# Patient Record
Sex: Female | Born: 1955
Health system: Southern US, Community
[De-identification: ages and names within clinical notes are randomized; demographics above are authoritative.]

---

## 1998-10-12 ENCOUNTER — Other Ambulatory Visit: Admission: RE | Admit: 1998-10-12 | Discharge: 1998-10-12 | Payer: Self-pay | Admitting: Obstetrics and Gynecology

## 1999-05-01 ENCOUNTER — Inpatient Hospital Stay (HOSPITAL_COMMUNITY): Admission: AD | Admit: 1999-05-01 | Discharge: 1999-05-02 | Payer: Self-pay | Admitting: *Deleted

## 1999-09-27 ENCOUNTER — Other Ambulatory Visit: Admission: RE | Admit: 1999-09-27 | Discharge: 1999-09-27 | Payer: Self-pay | Admitting: Obstetrics and Gynecology

## 2000-06-30 ENCOUNTER — Encounter: Payer: Self-pay | Admitting: Urology

## 2000-07-07 ENCOUNTER — Ambulatory Visit (HOSPITAL_COMMUNITY): Admission: RE | Admit: 2000-07-07 | Discharge: 2000-07-07 | Payer: Self-pay | Admitting: Urology

## 2000-07-24 ENCOUNTER — Observation Stay (HOSPITAL_COMMUNITY): Admission: RE | Admit: 2000-07-24 | Discharge: 2000-07-26 | Payer: Self-pay | Admitting: Urology

## 2000-09-23 ENCOUNTER — Other Ambulatory Visit: Admission: RE | Admit: 2000-09-23 | Discharge: 2000-09-23 | Payer: Self-pay | Admitting: Obstetrics and Gynecology

## 2000-12-11 ENCOUNTER — Encounter: Payer: Self-pay | Admitting: Internal Medicine

## 2000-12-11 ENCOUNTER — Emergency Department (HOSPITAL_COMMUNITY): Admission: EM | Admit: 2000-12-11 | Discharge: 2000-12-11 | Payer: Self-pay | Admitting: Internal Medicine

## 2001-11-24 ENCOUNTER — Other Ambulatory Visit: Admission: RE | Admit: 2001-11-24 | Discharge: 2001-11-24 | Payer: Self-pay | Admitting: Obstetrics and Gynecology

## 2002-11-26 ENCOUNTER — Other Ambulatory Visit: Admission: RE | Admit: 2002-11-26 | Discharge: 2002-11-26 | Payer: Self-pay | Admitting: Obstetrics and Gynecology

## 2004-02-08 ENCOUNTER — Other Ambulatory Visit: Admission: RE | Admit: 2004-02-08 | Discharge: 2004-02-08 | Payer: Self-pay | Admitting: Obstetrics and Gynecology

## 2005-02-14 ENCOUNTER — Other Ambulatory Visit: Admission: RE | Admit: 2005-02-14 | Discharge: 2005-02-14 | Payer: Self-pay | Admitting: Obstetrics and Gynecology

## 2005-02-27 ENCOUNTER — Encounter: Admission: RE | Admit: 2005-02-27 | Discharge: 2005-02-27 | Payer: Self-pay | Admitting: Endocrinology

## 2005-03-01 ENCOUNTER — Encounter: Admission: RE | Admit: 2005-03-01 | Discharge: 2005-03-01 | Payer: Self-pay | Admitting: Endocrinology

## 2005-09-19 IMAGING — US US ABDOMEN COMPLETE
1 series · 13 of 25 positions shown · non-contrast
Comparison: none

CLINICAL DATA: Bloating, vomiting, elevated LFTs.  Patient is diabetic.  Previous cholecystectomy. 
 COMPLETE ABDOMINAL ULTRASOUND: 
 Multiple scans of the entire abdomen show the gallbladder to have been removed.  Common bile duct appears to be within the normal limit with a diameter of 2.5mm.  The liver is echogenic in a diffuse fashion and shows no focal disease.  There is a relatively lucent area along the inferior margin of the liver that could represent a fluid collection vs a cyst on the margin of the liver which measures 2.8 x 2.9 x 7.8cm in maximum dimensions.  There is limited visualization of the inferior vena cava due to overlying gas and the same is true of the pancreas.  The spleen is normal and measures 12.0cm in length.  The right kidney is normal and measures 10.4cm.  The left kidney is normal and measures 10.9cm.  The abdominal aorta is normal and measures 2.1cm.  
 The entire study is limited due to gas and the patient?s body habitus.

[Series 1: unknown · 13 of 69 slices shown]
[im 1/69]
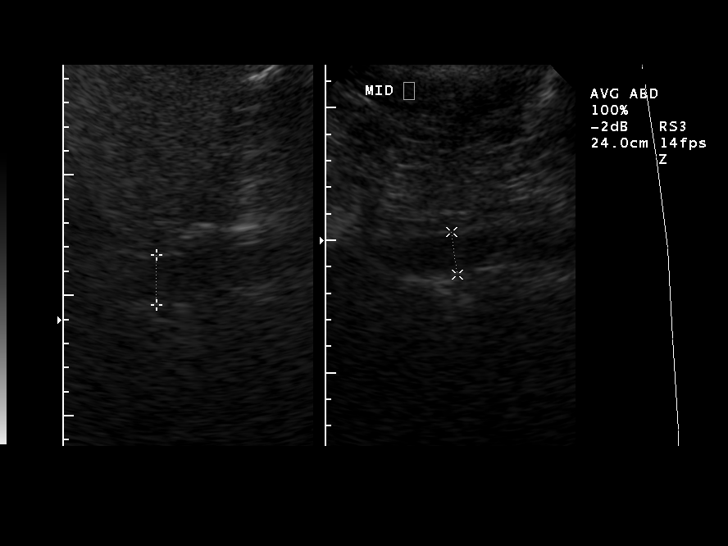
[im 6/69]
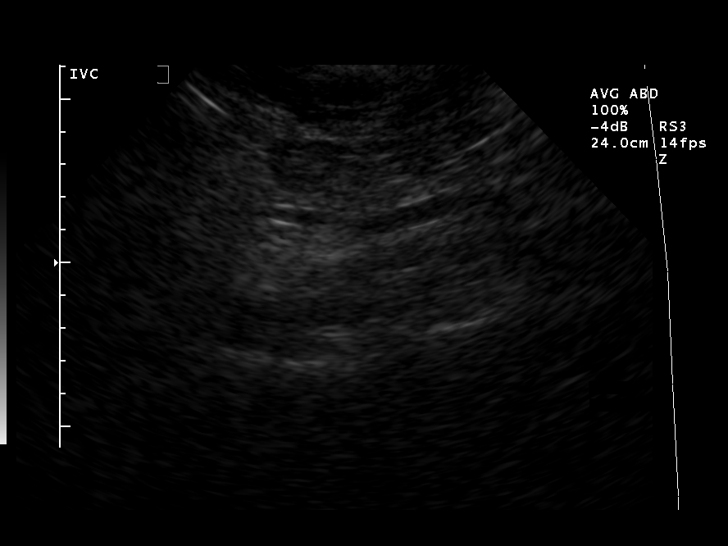
[im 12/69]
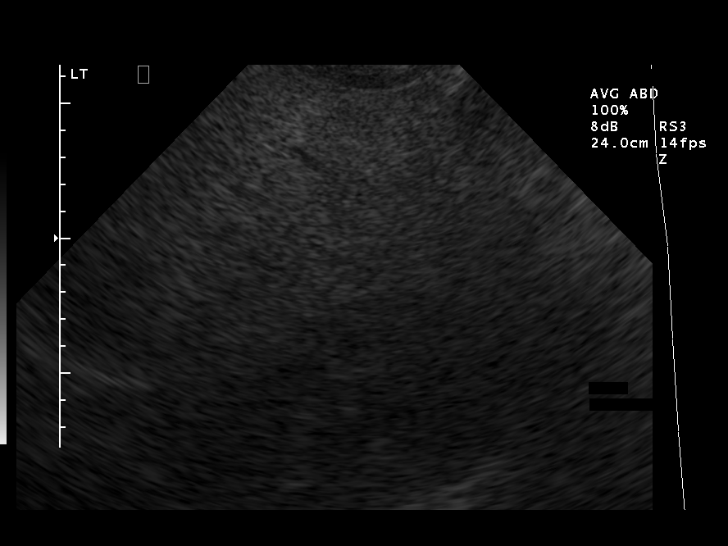
[im 18/69]
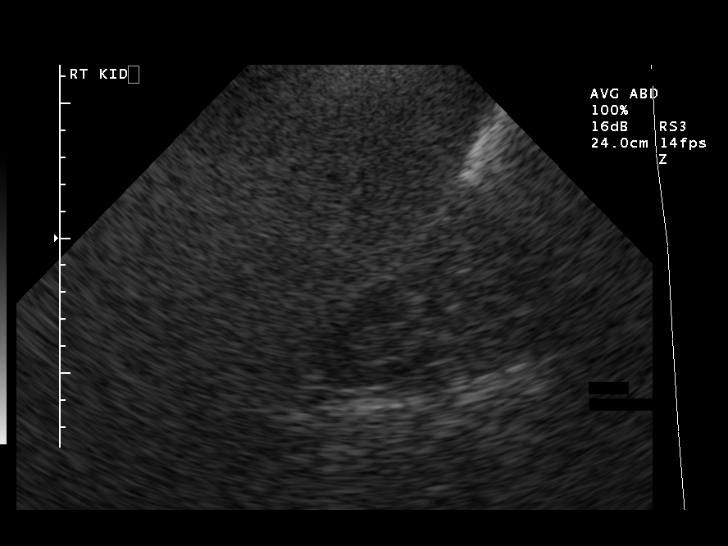
[im 23/69]
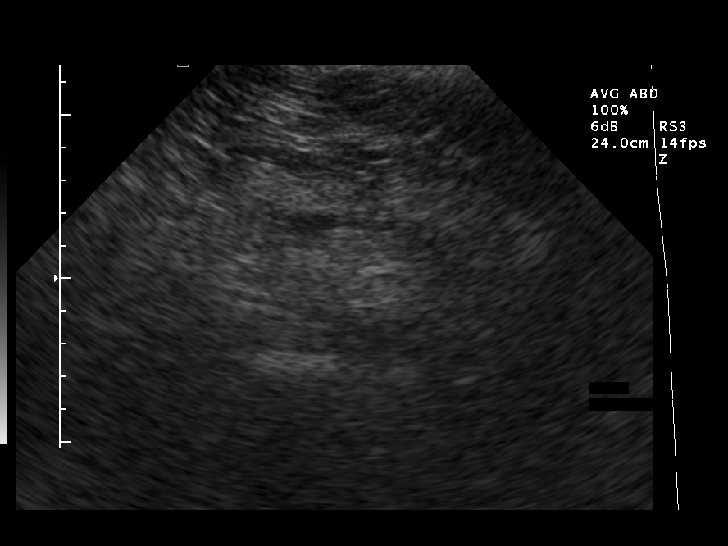
[im 29/69]
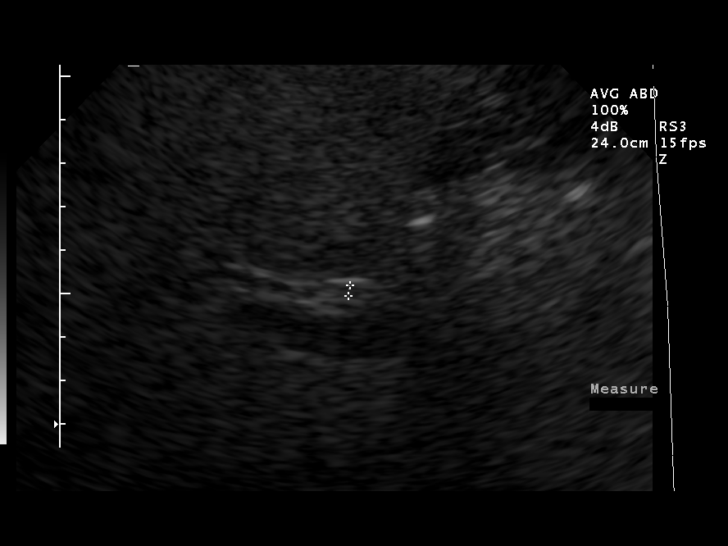
[im 35/69]
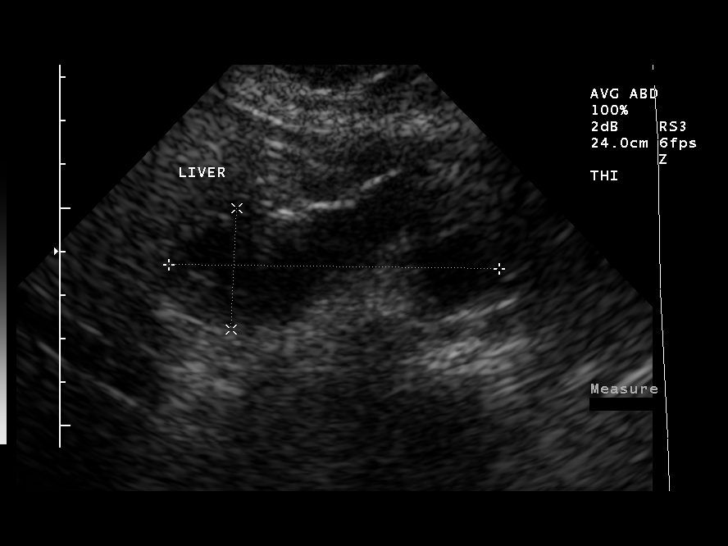
[im 40/69]
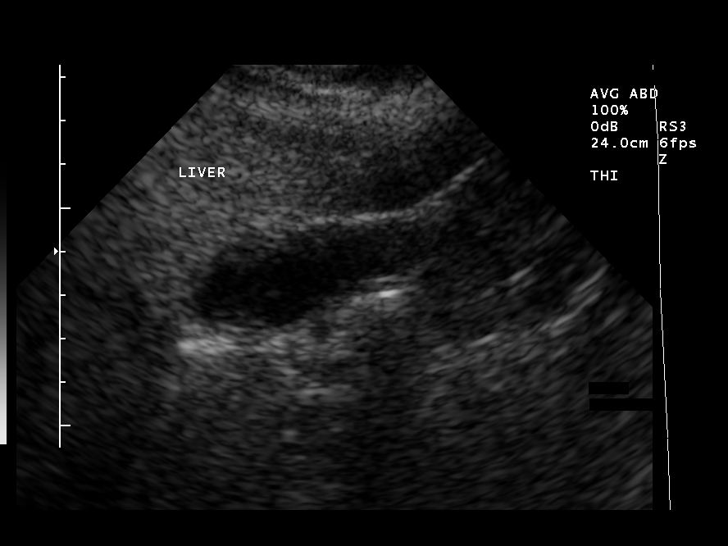
[im 46/69]
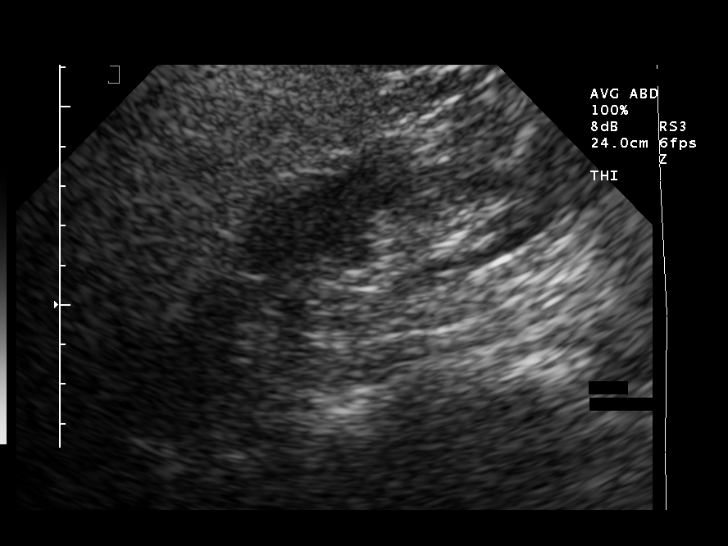
[im 52/69]
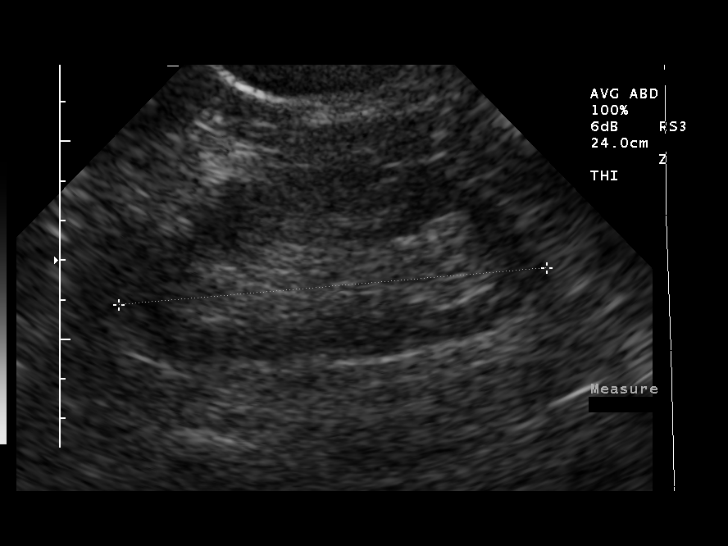
[im 57/69]
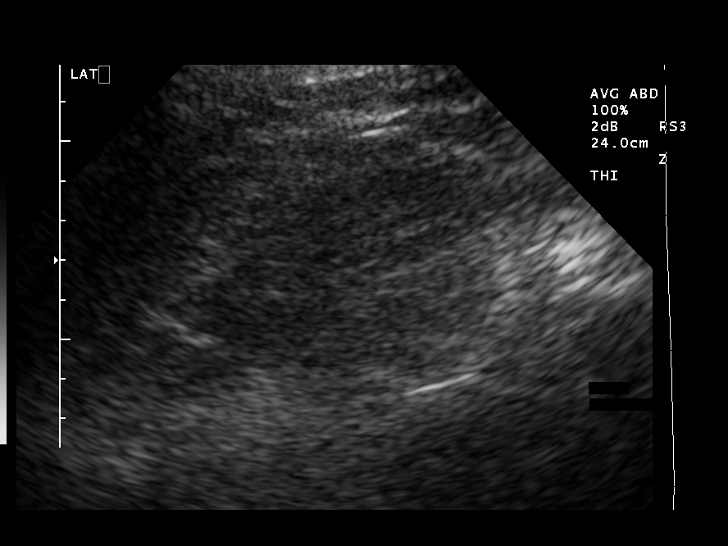
[im 63/69]
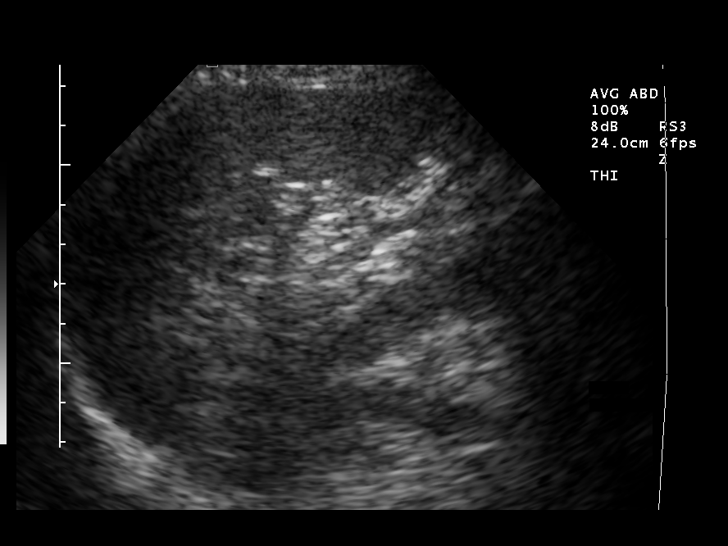
[im 69/69]
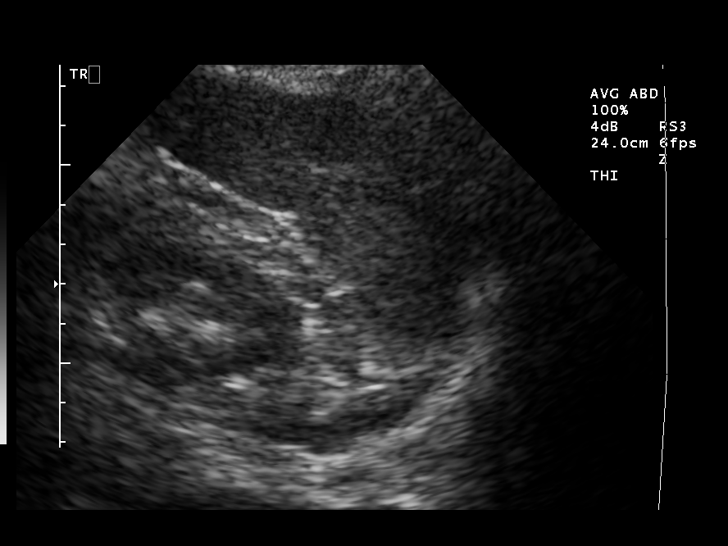

[13 of 25 positions shown; findings below may reference images not displayed]

IMPRESSION: 1.  Status post cholecystectomy with a fluid collection vs cyst along the margin of the liver measuring 2.8 x 2.9 x 7.8cm.  The liver is echogenic in a diffuse fashion.  The common bile duct appears normal and measures 2.5mm.  
 2.  I would recommend a CT scan of the abdomen with and without contrast using the pancreas protocol for further evaluation of this patient.

## 2005-09-21 IMAGING — CT CT ABDOMEN W/ CM
1 of 2 series · 15 of 32 positions shown, 19 images · IV contrast (GASTRO & [ID] OMNI [id number])
Comparison: none

CLINICAL DATA: Abdominal pain. Prior cholecystectomy. Repair of severed bile
duct.

[Series 2: routine abdomen · axial · 0.78mm/px · z∈[-306,-11]mm · 15 of 64 slices shown, 19 images]
[im 3/64  soft-tissue]
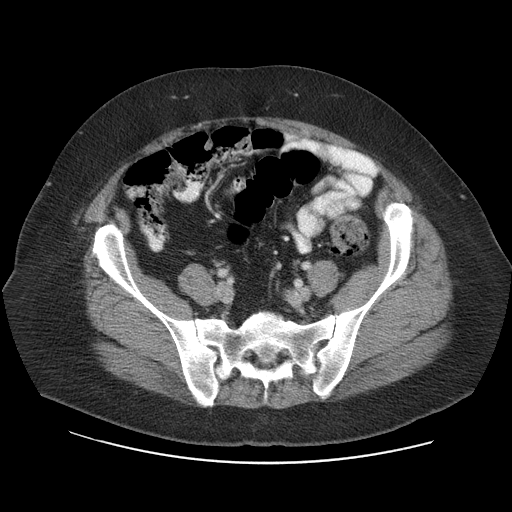
[im 3/64  bone]
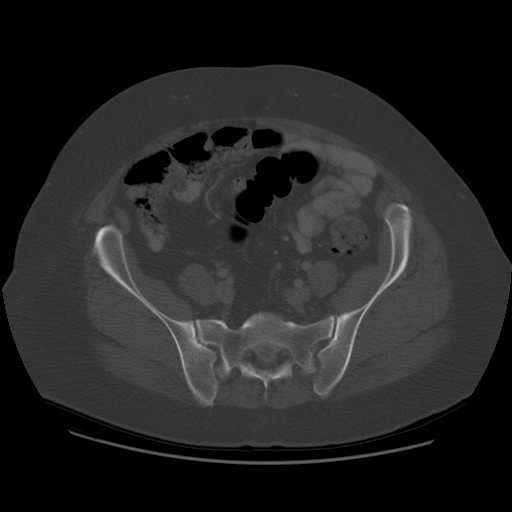
[im 9/64  soft-tissue]
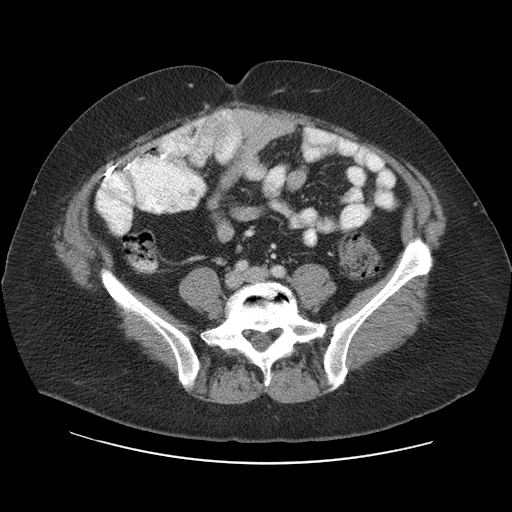
[im 15/64  soft-tissue]
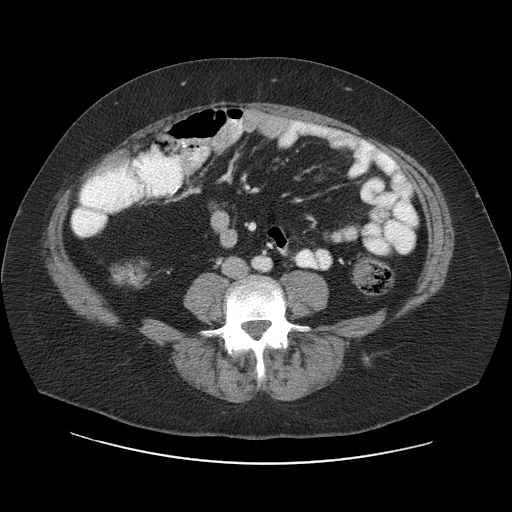
[im 18/64  soft-tissue]
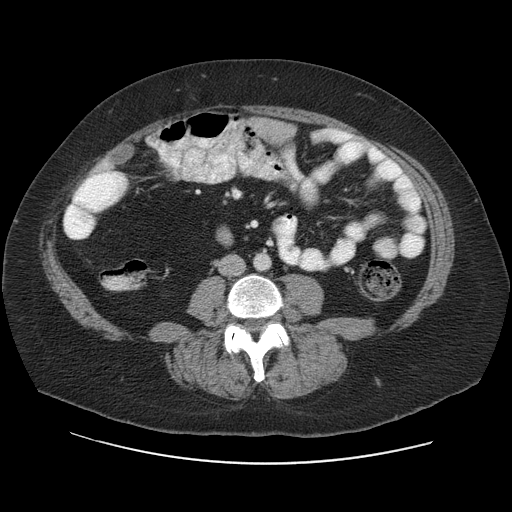
[im 23/64  soft-tissue]
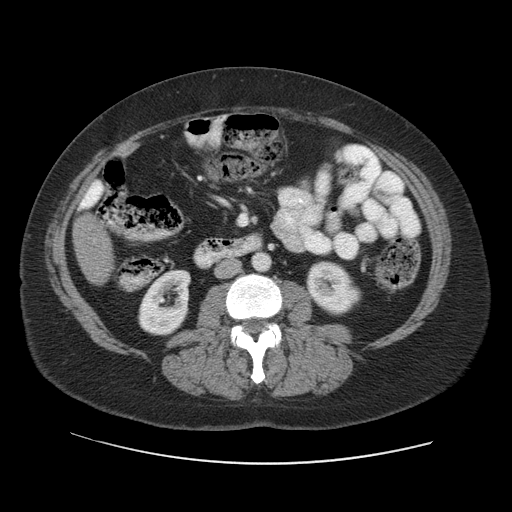
[im 26/64  soft-tissue]
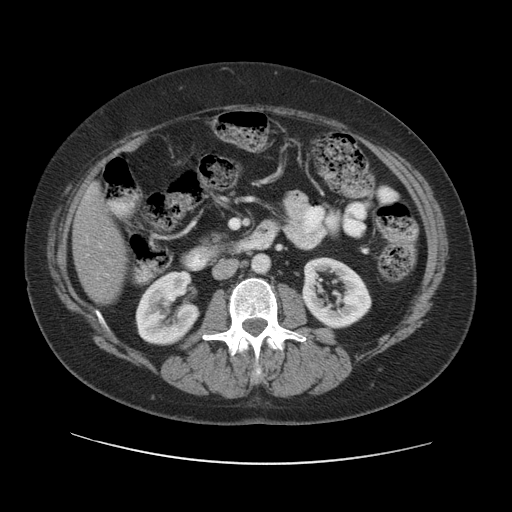
[im 32/64  soft-tissue]
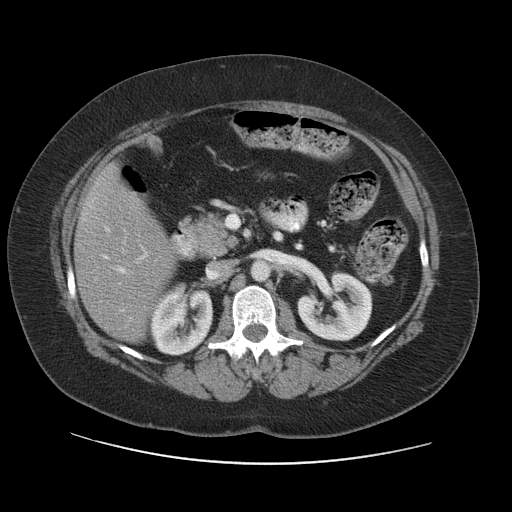
[im 38/64  soft-tissue]
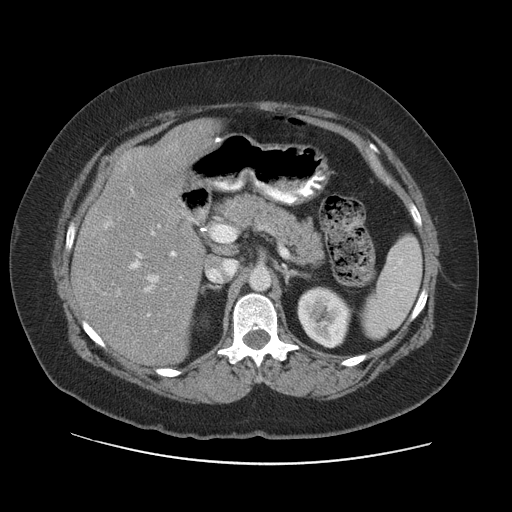
[im 41/64  soft-tissue]
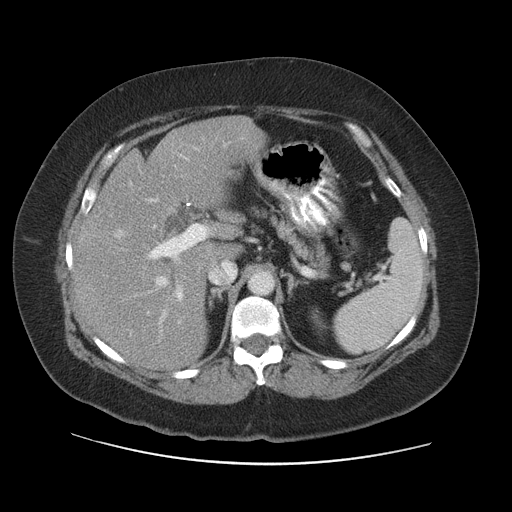
[im 41/64  bone]
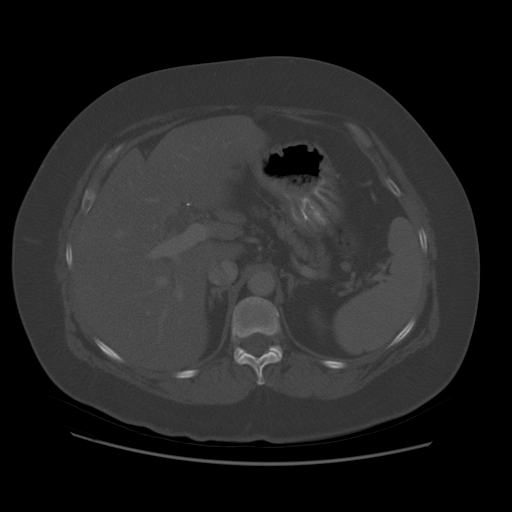
[im 46/64  soft-tissue]
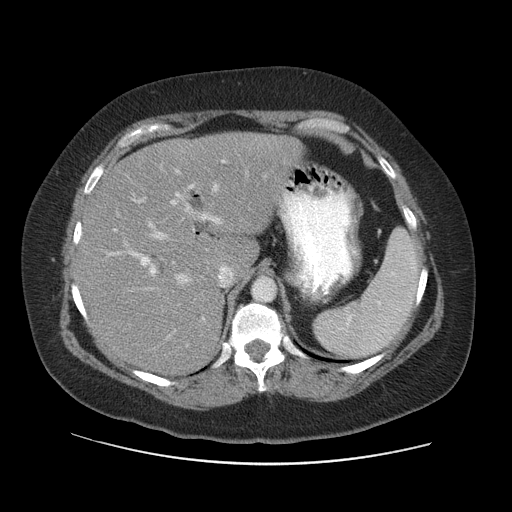
[im 49/64  soft-tissue]
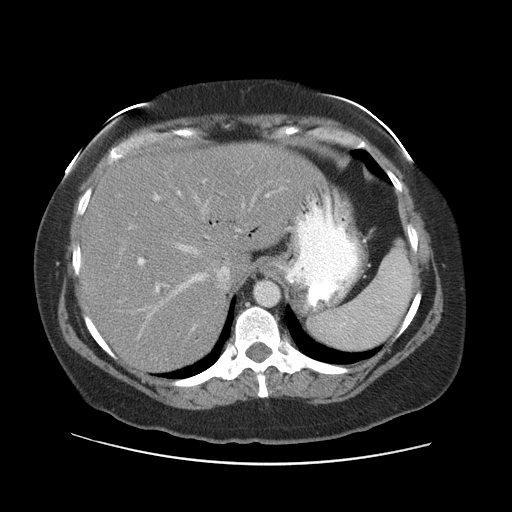
[im 52/64  lung]
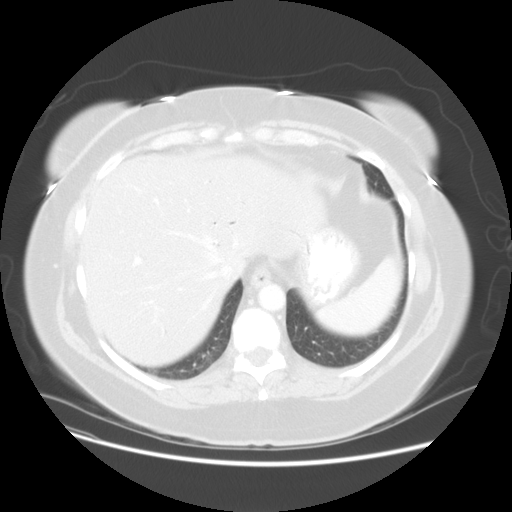
[im 55/64  soft-tissue]
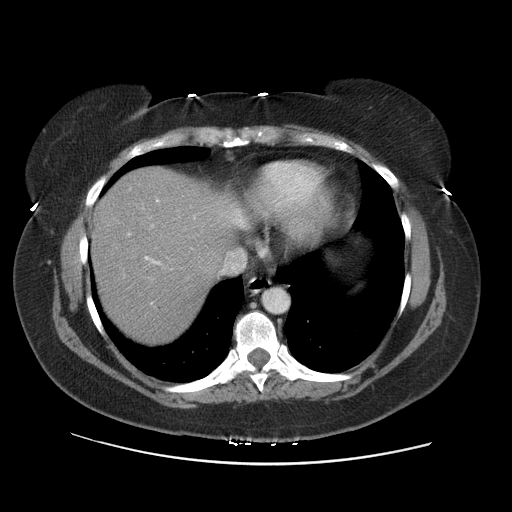
[im 55/64  lung]
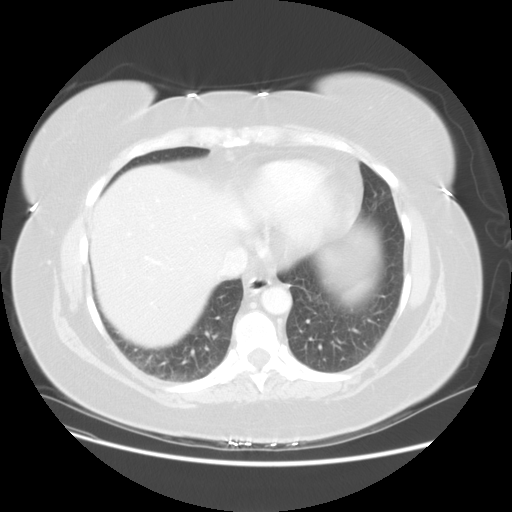
[im 58/64  lung]
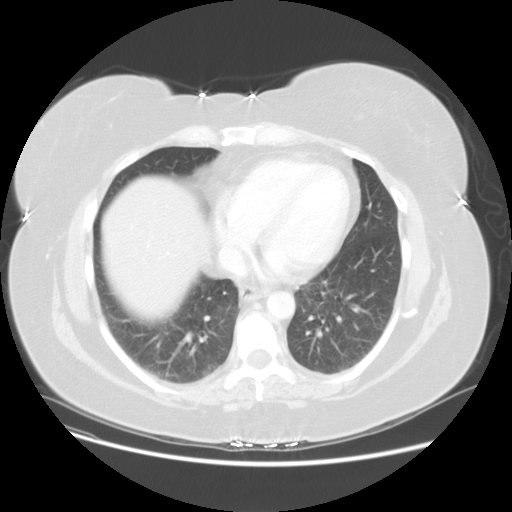
[im 61/64  soft-tissue]
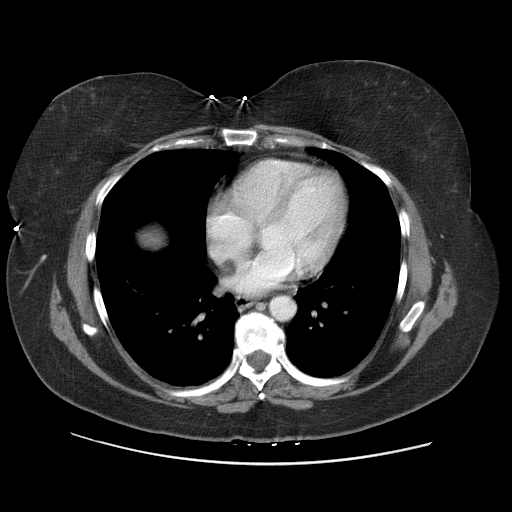
[im 61/64  lung]
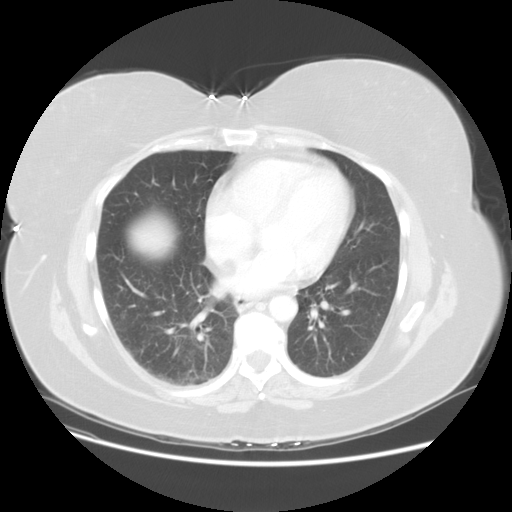

[15 of 32 positions shown; findings below may reference images not displayed]

CT abdomen with contrast:

Multidetector helical CT after 125 ml [id number]-[id number] IV. No previous CT for
comparison. Visualized portions of lung bases clear. Mild diffuse fatty
infiltration of the liver. There is gas in the biliary tree, which is mildly
dilated centrally. Vascular clips in the gallbladder fossa. The common bile duct
is nondilated. I suspect the patient may have had a choledochojejunostomy, with
a decompressed bowel loop extending from the gallbladder fossa anterolaterally
and inferiorly to the level of anastomotic staples with more distal small bowel.
The bowel at the level of the anastomosis is mildly distended. The remainder of
small bowel is decompressed, with good progression of the  oral contrast through
the remainder of visualized small bowel. Unremarkable spleen, adrenal glands,
kidneys, pancreas. Pancreatic duct decompressed. Portal vein remains patent. No
free air. No ascites. Mild degenerative changes in the lower lumbar spine.
Visualized portions of colon decompressed, unremarkable. Delayed scans showed
decompressed proximal ureters.
IMPRESSION: 1. Changes of probable choledochojejunostomy. Although there is mild dilatation
of the central biliary tree, the presence of gas within the   biliary tree would
imply patency of the anastomosis.
2. Mild diffuse fatty infiltration of the liver.

## 2007-07-16 ENCOUNTER — Encounter: Admission: RE | Admit: 2007-07-16 | Discharge: 2007-07-16 | Payer: Self-pay | Admitting: Endocrinology

## 2011-03-08 NOTE — Op Note (Signed)
[facility]  Patient:    Kristin Olson, Kristin Olson                       MRN: [id number] Proc. Date: 07/24/00 Adm. Date:  [id number] Attending:  Laqueta Jean                           Operative Report  PREOPERATIVE DIAGNOSIS:  Urinary incontinence.  POSTOPERATIVE DIAGNOSIS:  Urinary incontinence.  OPERATION:  Pubovaginal sling.  SURGEON:  Sigmund I. Patsi Sears, M.D.  ANESTHESIA:  General endotracheal.  PREPARATION:  After appropriate preanesthesia, the patient is brought to the operating room and placed on the operating table in the dorsal supine position where general endotracheal anesthesia was introduced.  She was then replaced in the low Allen stirrups, dorsal lithotomy position, where the pubis was prepped with Betadine solution and draped in the usual fashion.  DESCRIPTION OF PROCEDURE:  Marcaine 20 cc 0.5% with 1:200,000 epinephrine was injected into the pubovaginal cervical arch.  Following this, two separate incisions were made in the pubovaginal cervical arch, and dissection was carried as a tunnel across the midline.  A 7 cm portion of ______  fascia was selected, measuring 4 cm.  This was folded, sutured.  Dissection was carried into the retropubic space with some bleeding noted from Venus veins. The transvaginal anchor system was then used to place anchors in the retropubic space with #1 Prolene sutures attached.  The sutures were then attached to the sling which was placed across the tunnel and brought to the retropubic space.  Sutures were cut and bleeding stopped.  There was no tension on the urethra.  The incisions were irrigated copiously with antibiotic irrigation and closed with inverted running 3-0 Vicryl suture. Packing was placed.  Cystoscopy showed no suture within the bladder and normal bladder neck.  The Foley catheter.  The patient was awakened and taken to the recovery room after being given IV Toradol.  She is in good  condition. DD:  07/24/00 TD:  07/24/00 Job: [id number]

## 2013-04-22 ENCOUNTER — Ambulatory Visit (INDEPENDENT_AMBULATORY_CARE_PROVIDER_SITE_OTHER): Payer: Medicare Other | Admitting: Endocrinology

## 2013-04-22 ENCOUNTER — Encounter: Payer: Self-pay | Admitting: Endocrinology

## 2013-04-22 VITALS — BP 124/74 | HR 90 | Ht 64.0 in | Wt 180.8 lb

## 2013-04-22 DIAGNOSIS — E785 Hyperlipidemia, unspecified: Secondary | ICD-10-CM

## 2013-04-22 DIAGNOSIS — E119 Type 2 diabetes mellitus without complications: Secondary | ICD-10-CM

## 2013-04-22 DIAGNOSIS — IMO0002 Reserved for concepts with insufficient information to code with codable children: Secondary | ICD-10-CM

## 2013-04-22 NOTE — Patient Instructions (Addendum)
Sugar checks 2 hrs after meals 2-3x per week, exercise more  Slo- Niacin 1000mg  daily

## 2013-04-22 NOTE — Progress Notes (Signed)
Patient ID: Kristin Olson, female   DOB: 1955/12/19, 57 y.o.   MRN: [id number] Reason for Appointment: Diabetes follow-up   History of Present Illness   Diagnosis: Type 2 DIABETES MELITUS, date of diagnosis: 1999          Oral hypoglycemic drugs: Metformin, Victoza        Side effects from medications: None Proper timing of medications in relation to meals: Yes.         Monitors blood glucose: Once a day.    Glucometer: One Touch.          Blood Glucose readings: Glucometer not downloaded: readings before breakfast: 60-130; after meals upto 220.   Hypoglycemia frequency: none now come occasionally on Victoza.           Not on Victoza 2 weeks, because she thinks she had difficulty with nausea when she was taking this. Also cannot afford this because of insurance not covering it. Previously it had helped her with portion control and weight loss. Meals: 3 meals per day. she is however having difficulty controlling drinks with sugar and is irregular with meal planning even though she has been to the dietitian before          Physical activity: exercise: None recently, was unable to do so because of her biliary tract problems. She plans to start going to the [facility] next week                   Complications: are: None    The last HbgA1c was reported as 6.1    HYPERLIPIDEMIA:         The lipid abnormality consists of elevated LDL.  taking OTC Slo-niacin dose, currently 500 milligrams. She previously was on 1000 mg and is not sure why she had reduced the dose. Also has been irregular with her Lipitor for no apparent reason but recently started back again and her LDL is at target  LAB results: Glucose 83, A1c 6.1%, triglycerides 193, HDL 33 and LDL 71, normal liver functions, date of test 6/16     Medication List       This list is accurate as of: 04/22/13  9:14 AM.  Always use your most recent med list.               atorvastatin 20 MG tablet  Commonly known as:  LIPITOR  Take 20 mg by mouth  daily.     B-D ULTRAFINE III SHORT PEN 31G X 8 MM Misc  Generic drug:  Insulin Pen Needle  by Does not apply route. Use as directed     celecoxib 200 MG capsule  Commonly known as:  CELEBREX  Take 200 mg by mouth 2 (two) times daily. 1 capsule orally twice a day     glucose blood test strip  1 each by Other route as needed for other (One Touch Ultra Glucometer). Use as instructed     glucose blood test strip  1 each by Other route as needed for other (twice a day). Use as instructed     LORazepam 1 MG tablet  Commonly known as:  ATIVAN  1 mg.     metFORMIN 1000 MG tablet  Commonly known as:  GLUCOPHAGE  Take 1,000 mg by mouth 2 (two) times daily with a meal. 1 tablet orally     ONETOUCH DELICA LANCETS FINE Misc  by Does not apply route. Use twice a day     oxybutynin 15 MG 24  hr tablet  Commonly known as:  DITROPAN XL  Take 15 mg by mouth daily.     oxycodone 5 MG capsule  Commonly known as:  OXY-IR  Take 5 mg by mouth every 4 (four) hours as needed (2 tablets).     risperiDONE 2 MG tablet  Commonly known as:  RISPERDAL  Take 2 mg by mouth once.     sertraline 100 MG tablet  Commonly known as:  ZOLOFT  Take 100 mg by mouth daily. 2 tablets by mouth daily     SUMAtriptan 100 MG tablet  Commonly known as:  IMITREX  Take 100 mg by mouth every 4 (four) hours as needed for migraine.     VICTOZA 18 MG/3ML Sopn  Generic drug:  Liraglutide  Inject 1.8 mg into the skin.        No past medical history on file.  No past surgical history on file.  No family history on file.  Social History:  reports that she has never smoked. She does not have any smokeless tobacco history on file. Her alcohol and drug histories are not on file.  Allergies: Allergies not on file   Review of systems:  She has had problems with depression, asking about muscle cramps especially at night. Has not had any recent abnormality of liver functions which was due to obstructive biliary  disease. She has been treated at [facility] with biliary drainage tubes   Examination:   BP 124/74  Pulse 90  Ht 5\' 4"  (1.626 m)  Wt 180 lb 12.8 oz (82.01 kg)  BMI 31.02 kg/m2  SpO2 97%  Body mass index is 31.02 kg/(m^2).   Assesment:   1. Diabetes type 2, uncontrolled - 250.02  The patient's diabetes control appears to be well controlled despite her not taking Victoza. Also her weight appears to be improved since March despite not exercising and taking Victoza recently. Not clear why she reports symptoms of hypoglycemia when she was taking Victoza and claims of blood sugars as low as 40. However has not checked her blood sugar recently very much and not clear if  her meter is accurate. Her husband is present today and discussed recumbency and timing of glucose monitoring, timing of glucose monitoring, balanced meals and avoiding excessive sugar and drinks as well as exercise. She will bring her monitor for download on the next visit. HYPERTENSION: Currently not requiring medications HYPERLIPIDEMIA: Her HDL is still relatively lower at 33 the she had been on 1000 mg of niacin and is only taking 500 now without any side effects. Previous evaluation had shown small dense particles and have encouraged her to go back to 1000 mg, discussed prevention of flushing with aspirin    PLAN:   1. To check home blood sugars on waking up or 2 hours after any meal  2. Walking or other exercise for up to 30 minutes every day.  3. Medication changes:  none, may leave off Victoza 4. Increased niacin as above   Kristin Olson 04/22/2013, 9:14 AM

## 2013-05-28 ENCOUNTER — Other Ambulatory Visit: Payer: Self-pay | Admitting: *Deleted

## 2013-05-28 MED ORDER — METFORMIN HCL 1000 MG PO TABS: 1000.0000 mg | ORAL_TABLET | Freq: Two times a day (BID) | ORAL | Status: DC

## 2013-05-28 MED ORDER — ATORVASTATIN CALCIUM 20 MG PO TABS: 20.0000 mg | ORAL_TABLET | Freq: Every day | ORAL | Status: DC

## 2013-06-14 ENCOUNTER — Encounter: Payer: Self-pay | Admitting: Endocrinology

## 2013-07-23 ENCOUNTER — Ambulatory Visit: Payer: Medicare Other | Admitting: Endocrinology

## 2013-07-26 ENCOUNTER — Ambulatory Visit: Payer: Medicare Other | Admitting: Endocrinology

## 2013-08-05 LAB — COMPREHENSIVE METABOLIC PANEL
AST: 26 IU/L (ref 0–40)
Albumin/Globulin Ratio: 1.7 (ref 1.1–2.5)
Albumin: 4.3 g/dL (ref 3.5–5.5)
BUN: 9 mg/dL (ref 6–24)
CO2: 27 mmol/L (ref 18–29)
GFR calc Af Amer: 78 mL/min/{1.73_m2} (ref >59)
GFR calc non Af Amer: 67 mL/min/{1.73_m2} (ref >59)
Globulin, Total: 2.5 g/dL (ref 1.5–4.5)
Glucose: 103 mg/dL — ABNORMAL HIGH (ref 65–99)
Total Bilirubin: 0.2 mg/dL (ref 0.0–1.2)
Total Protein: 6.8 g/dL (ref 6.0–8.5)

## 2013-08-05 LAB — NMR, LIPOPROFILE
HDL Cholesterol by NMR: 32 mg/dL — ABNORMAL LOW (ref >=40)
HDL Particle Number: 31.5 umol/L (ref >=30.5)
LDL Particle Number: 1177 nmol/L — ABNORMAL HIGH (ref <1000)
LDL Size: 19.4 nm — ABNORMAL LOW (ref >20.5)
LDLC SERPL CALC-MCNC: 55 mg/dL (ref <100)
LP-IR Score: 80 — ABNORMAL HIGH (ref <=45)

## 2013-08-26 ENCOUNTER — Other Ambulatory Visit: Payer: Self-pay

## 2013-08-26 ENCOUNTER — Encounter: Payer: Self-pay | Admitting: Endocrinology

## 2013-08-26 ENCOUNTER — Ambulatory Visit (INDEPENDENT_AMBULATORY_CARE_PROVIDER_SITE_OTHER): Payer: Medicare Other | Admitting: Endocrinology

## 2013-08-26 ENCOUNTER — Other Ambulatory Visit: Payer: Self-pay | Admitting: *Deleted

## 2013-08-26 VITALS — BP 122/82 | HR 85 | Temp 98.3°F | Resp 12 | Ht 64.0 in | Wt 195.2 lb

## 2013-08-26 DIAGNOSIS — E785 Hyperlipidemia, unspecified: Secondary | ICD-10-CM

## 2013-08-26 NOTE — Patient Instructions (Signed)
Slo-Niacin 2x daily  Check cost of Bydureon

## 2013-08-26 NOTE — Progress Notes (Signed)
Patient ID: Kristin Olson, female   DOB: Mar 13, 1956, 57 y.o.   MRN: [id number]  Reason for Appointment: Diabetes follow-up   History of Present Illness   Diagnosis: Type 2 DIABETES MELITUS, date of diagnosis: 1999     She has had mild diabetes for several years which has been usually well controlled with metformin alone. Because of somewhat higher blood sugars and if her weight gain she was tried on diet in 2006 and subsequently Victoza  However because she thinks she had difficulty with nausea when she was taking this and could not afford this because of insurance noncoverage she stopped taking this on her last visit.  Previously it had helped her with portion control and weight loss.     She has gained weight although her blood sugars   as judged by A1c are not higher. She did have high readings about 2 weeks ago with epidural steroid She still has periodic high blood sugars although she claims that they are not as high with switching her glucose monitor about 5 days ago  Oral hypoglycemic drugs: Metformin, Victoza        Side effects from medications: None Proper timing of medications in relation to meals: Yes.         Monitors blood glucose: Once a day.    Glucometer: One Touch.          Blood Glucose readings:  recently 79-181 but previously as high as 208. Overall median 140 Hypoglycemia frequency: none now       Meals: 3 meals per day. she is now controlling drinks with sugar and is irregular with meal planning even though she has been to the dietitian before          Physical activity: exercise: None recently, was unable to do so because of her back pain                  Complications: are: None    The prior HbgA1c was reported as 6.1    HYPERLIPIDEMIA:         The lipid abnormality consists of elevated LDL, high triglycerides and low HDL.  she is taking OTC Slo-niacin dose, currently only 500 mg.  She previously was on 1000 mg and is not sure why she had reduced the dose.  Her  LDL has been at target   LAB results:  No visits with results within 1 Week(s) from this visit. Latest known visit with results is:  Office Visit on 04/22/2013  Component Date Value Range Status  . LDL Particle Number 08/03/2013 1177* <1000 nmol/L Final   Comment:                           Low                   < 1000                                                    Moderate         1000 - 1299  Borderline-High  1300 - 1599                                                    High             1600 - 2000                                                    Very High             > 2000  . LDLC SERPL CALC-MCNC 08/03/2013 55  <100 mg/dL Final   Comment:                           Optimal               <  100                                                    Above optimal     100 -  129                                                    Borderline        130 -  159                                                    High              160 -  189                                                    Very high             >  189  . HDL Cholesterol by NMR 08/03/2013 32* >=40 mg/dL Final  . Triglycerides by NMR 08/03/2013 216* <150 mg/dL Final  . Cholesterol 40/34/7425 130  <200 mg/dL Final  . HDL Particle Number 08/03/2013 31.5  >=30.5 umol/L Final  . Small LDL Particle Number 08/03/2013 1146* <=527 nmol/L Final  . LDL Size 08/03/2013 19.4* >95.6 nm Final   Comment:  ----------------------------------------------------------                                           ** INTERPRETATIVE INFORMATION**  PARTICLE CONCENTRATION AND SIZE                                              <--Lower CVD Risk   Higher CVD Risk-->                            LDL AND HDL PARTICLES   Percentile in Reference Population                            HDL-P (total)        High     75th    50th    25th   Low                                                  >34.9    34.9    30.5    26.7   <26.7                            Small LDL-P          Low      25th    50th    75th   High                                                 <117     117     527     839    >839                            LDL Size   <-Large (Pattern A)->    <-Small (Pattern B)->                                              23.0    20.6           20.5      19.0                           ----------------------------------------------------------  . LP-IR Score 08/03/2013 80* <=45 Final   Comment: The LP-IR Score combines information from lipoprotein                          particle concentration and size to give improved                          assessment of insulin resistance and diabetes risk.                          Small LDL-P, LDL Particle Size, HDL-Particle, and  LP-IR Score have been validated by [employer]                          but not cleared by [place] FDA; the clinical utility                          of these test results has not been fully established.                          INSULIN RESISTANCE MARKER                              <--Insulin Sensitive    Insulin Resistant-->                                     Percentile in Reference Population                          Insulin Resistance Score                          LP-IR Score   Low   25th   50th   75th   High                                        <27   27     45     63     >63  . Glucose 08/03/2013 103* 65 - 99 mg/dL Final  . BUN 40/98/1191 9  6 - 24 mg/dL Final  . Creatinine, Ser 08/03/2013 0.93  0.57 - 1.00 mg/dL Final  . GFR calc non Af Amer 08/03/2013 67  >59 mL/min/1.73 Final  . GFR calc Af Amer 08/03/2013 78  >59 mL/min/1.73 Final  . BUN/Creatinine Ratio 08/03/2013 10  9 - 23 Final  . Sodium 08/03/2013 145* 134 - 144 mmol/L Final  . Potassium 08/03/2013 4.5  3.5 - 5.2 mmol/L Final  . Chloride 08/03/2013 101  97 - 108 mmol/L Final  . CO2 08/03/2013 27  18 - 29  mmol/L Final  . Calcium 08/03/2013 9.6  8.7 - 10.2 mg/dL Final  . Total Protein 08/03/2013 6.8  6.0 - 8.5 g/dL Final  . Albumin 47/82/9562 4.3  3.5 - 5.5 g/dL Final  . Globulin, Total 08/03/2013 2.5  1.5 - 4.5 g/dL Final  . Albumin/Globulin Ratio 08/03/2013 1.7  1.1 - 2.5 Final  . Total Bilirubin 08/03/2013 0.2  0.0 - 1.2 mg/dL Final  . Alkaline Phosphatase 08/03/2013 102  39 - 117 IU/L Final  . AST 08/03/2013 26  0 - 40 IU/L Final  . ALT 08/03/2013 28  0 - 32 IU/L Final  . Hemoglobin A1C 08/03/2013 6.3* 4.8 - 5.6 % Final   Comment:          Increased risk for diabetes: 5.7 - 6.4                                   Diabetes: >6.4  Glycemic control for adults with diabetes: <7.0  . Estimated average glucose 08/03/2013 134   Final     Glucose 83, A1c 6.1%, triglycerides 193, HDL 33 and LDL 71, normal liver functions, date of test 6/16     Medication List       This list is accurate as of: 08/26/13 10:43 AM.  Always use your most recent med list.               aspirin 325 MG tablet  Take 325 mg by mouth daily.     atorvastatin 20 MG tablet  Commonly known as:  LIPITOR  Take 1 tablet (20 mg total) by mouth daily.     buPROPion 150 MG 24 hr tablet  Commonly known as:  WELLBUTRIN XL  Take 150 mg by mouth 3 (three) times daily.     calcium citrate-vitamin D 315-200 MG-UNIT per tablet  Commonly known as:  CITRACAL+D  Take 1 tablet by mouth 2 (two) times daily.     gabapentin 400 MG capsule  Commonly known as:  NEURONTIN  Take 400 mg by mouth 2 (two) times daily.     glucose blood test strip  1 each by Other route as needed for other (One Touch Ultra Glucometer). Use as instructed     glucose blood test strip  1 each by Other route as needed for other (twice a day). Use as instructed     LORazepam 1 MG tablet  Commonly known as:  ATIVAN  1 mg.     metFORMIN 1000 MG tablet  Commonly known as:  GLUCOPHAGE  Take 1 tablet (1,000 mg total)  by mouth 2 (two) times daily with a meal. 1 tablet orally     mirtazapine 7.5 MG tablet  Commonly known as:  REMERON  Take 2 mg by mouth at bedtime.     omeprazole 20 MG capsule  Commonly known as:  PRILOSEC  20 mg.     ONETOUCH DELICA LANCETS FINE Misc  by Does not apply route. Use twice a day     oxybutynin 15 MG 24 hr tablet  Commonly known as:  DITROPAN XL  Take 15 mg by mouth daily.     oxycodone 5 MG capsule  Commonly known as:  OXY-IR  Take 5 mg by mouth every 4 (four) hours as needed (2 tablets).     risperiDONE 2 MG tablet  Commonly known as:  RISPERDAL  Take 2 mg by mouth once.     sertraline 100 MG tablet  Commonly known as:  ZOLOFT  Take 100 mg by mouth daily. 2 tablets by mouth daily     SUMAtriptan 100 MG tablet  Commonly known as:  IMITREX  Take 100 mg by mouth every 4 (four) hours as needed for migraine.     VICTOZA 18 MG/3ML Sopn  Generic drug:  Liraglutide  Inject 1.8 mg into the skin.        No past medical history on file.  No past surgical history on file.  No family history on file.  Social History:  reports that she has never smoked. She does not have any smokeless tobacco history on file. Her alcohol and drug histories are not on file.  Allergies:  Allergies  Allergen Reactions  . Levaquin [Levofloxacin In D5w] Itching     Review of systems:  She has had problems with depression long-term  Has not had any recent abnormality of liver functions which was due to obstructive biliary  disease. She has been treated at [facility] with biliary drainage tubes   Examination:   BP 122/82  Pulse 85  Temp(Src) 98.3 F (36.8 C)  Resp 12  Ht 5\' 4"  (1.626 m)  Wt 195 lb 3.2 oz (88.542 kg)  BMI 33.49 kg/m2  SpO2 96%  Body mass index is 33.49 kg/(m^2).   No ankle edema  Assesment:   Diabetes type 2, uncontrolled - 250.02  The patient's diabetes control appears to be overall fair with high normal A1c but periodic postprandial hyperglycemia  with stopping her Victoza She also has gained a lot of weight since her last visit She has not been able to exercise much because of low back pain  HYPERLIPIDEMIA: Her HDL is still relatively low at 32 he also LDL particle number is high. She did not take her niacin twice a day as directed and is still taking 500 mg  PLAN:  She will check into the cost of Bydureon since she has better blood sugars with GLP-1 drugs and better rate control May consider Januvia if not able to get this however she does not want to take any brand-new medications Encouraged her to start exercising when she is able to  She will increase her slow niacin twice a day for better lipid control  Roverto Bodmer 08/26/2013, 10:43 AM

## 2013-11-09 ENCOUNTER — Other Ambulatory Visit: Payer: Self-pay | Admitting: Endocrinology

## 2013-11-15 ENCOUNTER — Other Ambulatory Visit: Payer: Self-pay | Admitting: *Deleted

## 2013-11-15 MED ORDER — ATORVASTATIN CALCIUM 20 MG PO TABS: 20.0000 mg | ORAL_TABLET | Freq: Every day | ORAL | Status: DC

## 2013-11-15 MED ORDER — METFORMIN HCL 1000 MG PO TABS: 1000.0000 mg | ORAL_TABLET | Freq: Two times a day (BID) | ORAL | Status: DC

## 2013-11-26 ENCOUNTER — Ambulatory Visit: Payer: Medicare Other | Admitting: Endocrinology

## 2013-12-13 LAB — COMPREHENSIVE METABOLIC PANEL
A/G RATIO: 1.8 (ref 1.1–2.5)
ALT: 33 IU/L — AB (ref 0–32)
AST: 30 IU/L (ref 0–40)
Albumin: 4.6 g/dL (ref 3.5–5.5)
Alkaline Phosphatase: 105 IU/L (ref 39–117)
BUN/Creatinine Ratio: 10 (ref 9–23)
BUN: 9 mg/dL (ref 6–24)
CALCIUM: 10.2 mg/dL (ref 8.7–10.2)
CHLORIDE: 100 mmol/L (ref 97–108)
CO2: 25 mmol/L (ref 18–29)
Creatinine, Ser: 0.93 mg/dL (ref 0.57–1.00)
GFR calc Af Amer: 78 mL/min/{1.73_m2} (ref >59)
GFR, EST NON AFRICAN AMERICAN: 67 mL/min/{1.73_m2} (ref >59)
GLUCOSE: 125 mg/dL — AB (ref 65–99)
Globulin, Total: 2.6 g/dL (ref 1.5–4.5)
POTASSIUM: 4.7 mmol/L (ref 3.5–5.2)
SODIUM: 143 mmol/L (ref 134–144)
TOTAL PROTEIN: 7.2 g/dL (ref 6.0–8.5)
Total Bilirubin: 0.4 mg/dL (ref 0.0–1.2)

## 2013-12-13 LAB — LIPID PANEL
CHOL/HDL RATIO: 4.2 ratio (ref 0.0–4.4)
CHOLESTEROL TOTAL: 160 mg/dL (ref 100–199)
HDL: 38 mg/dL — AB (ref >39)
LDL Calculated: 73 mg/dL (ref 0–99)
TRIGLYCERIDES: 243 mg/dL — AB (ref 0–149)
VLDL Cholesterol Cal: 49 mg/dL — ABNORMAL HIGH (ref 5–40)

## 2013-12-13 LAB — MICROALBUMIN / CREATININE URINE RATIO
Creatinine, Ur: 199.3 mg/dL (ref 15.0–278.0)
MICROALB/CREAT RATIO: 2.9 mg/g{creat} (ref 0.0–30.0)
Microalbumin, Urine: 5.7 ug/mL (ref 0.0–17.0)

## 2013-12-13 LAB — HEMOGLOBIN A1C
ESTIMATED AVERAGE GLUCOSE: 137 mg/dL
Hgb A1c MFr Bld: 6.4 % — ABNORMAL HIGH (ref 4.8–5.6)

## 2013-12-15 ENCOUNTER — Ambulatory Visit: Payer: Medicare Other | Admitting: Endocrinology

## 2014-01-10 ENCOUNTER — Encounter: Payer: Self-pay | Admitting: Endocrinology

## 2014-01-10 ENCOUNTER — Ambulatory Visit (INDEPENDENT_AMBULATORY_CARE_PROVIDER_SITE_OTHER): Payer: Medicare Other | Admitting: Endocrinology

## 2014-01-10 ENCOUNTER — Other Ambulatory Visit: Payer: Self-pay | Admitting: *Deleted

## 2014-01-10 VITALS — BP 126/82 | HR 124 | Temp 97.8°F | Resp 16 | Ht 64.0 in | Wt 193.6 lb

## 2014-01-10 DIAGNOSIS — E785 Hyperlipidemia, unspecified: Secondary | ICD-10-CM

## 2014-01-10 DIAGNOSIS — IMO0001 Reserved for inherently not codable concepts without codable children: Secondary | ICD-10-CM

## 2014-01-10 DIAGNOSIS — E1165 Type 2 diabetes mellitus with hyperglycemia: Principal | ICD-10-CM

## 2014-01-10 MED ORDER — ATORVASTATIN CALCIUM 20 MG PO TABS: 20.0000 mg | ORAL_TABLET | Freq: Every day | ORAL | Status: DC

## 2014-01-10 MED ORDER — METFORMIN HCL 1000 MG PO TABS: 1000.0000 mg | ORAL_TABLET | Freq: Two times a day (BID) | ORAL | Status: DC

## 2014-01-10 MED ORDER — GLUCOSE BLOOD VI STRP: ORAL_STRIP | Status: DC

## 2014-01-10 NOTE — Patient Instructions (Signed)
Start walking when able to  Am sugar 3x per week  Metformin in am: 1/2 daily ; 1 1/2 at dinner

## 2014-01-10 NOTE — Progress Notes (Signed)
Patient ID: Kristin Olson, female   DOB: 1956/03/20, 58 y.o.   MRN: [id number]   Reason for Appointment: Diabetes follow-up   History of Present Illness   Diagnosis: Type 2 DIABETES MELITUS, date of diagnosis: 1999     She has had mild diabetes for several years which has been usually well controlled with metformin alone. Because of somewhat higher blood sugars and if her weight gain she was tried on diet in 2006 and subsequently Victoza. Previously Victoza had helped her with portion control and weight loss.    However because she had difficulty with nausea when she was taking this and could not afford this because of insurance noncoverage she stopped taking this in 2014   She is continuing treatment with metformin alone She still has periodic mild increase in glucose but is not checking them after dinner much Also she thinks she gets shaky if the blood sugar is low normal  Oral hypoglycemic drugs: Metformin 1 g twice a day        Side effects from medications: None Proper timing of medications in relation to meals: Yes.         Monitors blood glucose:  0.5 times a day     Glucometer: One Touch.       PREMEAL Breakfast Lunch Dinner Bedtime Overall  Glucose range:  140   105-190   99, 121     Mean/median:     117   120    POST-MEAL PC Breakfast PC Lunch PC Dinner  Glucose range:  159   100-116    Mean/median:      Hypoglycemia: She feels symptomatic At lunch or dinner if blood sugar is less than 110      Meals: 3 meals per day. she is usually controlling drinks with sugar and is irregular with meal planning even though she has been to the dietitian before           Wt Readings from Last 3 Encounters:  01/10/14 193 lb 9.6 oz (87.816 kg)  08/26/13 195 lb 3.2 oz (88.542 kg)  04/22/13 180 lb 12.8 oz (82.01 kg)   Physical activity: exercise: None recently, still recovering from her back surgery                 Complications are: None      Lab Results  Component Value Date   HGBA1C 6.4* 11/09/2013   HGBA1C 6.3* 08/03/2013   Lab Results  Component Value Date   LDLCALC 73 11/09/2013   CREATININE 0.93 11/09/2013    No visits with results within 1 Week(s) from this visit. Latest known visit with results is:  Orders Only on 11/09/2013  Component Date Value Ref Range Status  . Glucose 11/09/2013 125* 65 - 99 mg/dL Final  . BUN 78/29/5621 9  6 - 24 mg/dL Final  . Creatinine, Ser 11/09/2013 0.93  0.57 - 1.00 mg/dL Final  . GFR calc non Af Amer 11/09/2013 67  >59 mL/min/1.73 Final  . GFR calc Af Amer 11/09/2013 78  >59 mL/min/1.73 Final  . BUN/Creatinine Ratio 11/09/2013 10  9 - 23 Final  . Sodium 11/09/2013 143  134 - 144 mmol/L Final  . Potassium 11/09/2013 4.7  3.5 - 5.2 mmol/L Final  . Chloride 11/09/2013 100  97 - 108 mmol/L Final  . CO2 11/09/2013 25  18 - 29 mmol/L Final  . Calcium 11/09/2013 10.2  8.7 - 10.2 mg/dL Final  . Total Protein 11/09/2013 7.2  6.0 -  8.5 g/dL Final  . Albumin 16/07/9603 4.6  3.5 - 5.5 g/dL Final  . Globulin, Total 11/09/2013 2.6  1.5 - 4.5 g/dL Final  . Albumin/Globulin Ratio 11/09/2013 1.8  1.1 - 2.5 Final  . Total Bilirubin 11/09/2013 0.4  0.0 - 1.2 mg/dL Final  . Alkaline Phosphatase 11/09/2013 105  39 - 117 IU/L Final  . AST 11/09/2013 30  0 - 40 IU/L Final  . ALT 11/09/2013 33* 0 - 32 IU/L Final  . Cholesterol, Total 11/09/2013 160  100 - 199 mg/dL Final  . Triglycerides 11/09/2013 243* 0 - 149 mg/dL Final  . HDL 54/06/8118 38* >39 mg/dL Final   Comment: According to ATP-III Guidelines, HDL-C >59 mg/dL is considered a                          negative risk factor for CHD.  Marland Kitchen VLDL Cholesterol Cal 11/09/2013 49* 5 - 40 mg/dL Final  . LDL Calculated 11/09/2013 73  0 - 99 mg/dL Final  . Chol/HDL Ratio 11/09/2013 4.2  0.0 - 4.4 ratio units Final   Comment:                                   T. Chol/HDL Ratio                                                                      Men  Women                                                         1/2 Avg.Risk  3.4    3.3                                                            Avg.Risk  5.0    4.4                                                         2X Avg.Risk  9.6    7.1                                                         3X Avg.Risk 23.4   11.0  . Creatinine, Ur 11/09/2013 199.3  15.0 - 278.0 mg/dL Final  . Microalbum.,U,Random 11/09/2013 5.7  0.0 - 17.0 ug/mL Final  . MICROALB/CREAT RATIO 11/09/2013 2.9  0.0 - 30.0 mg/g creat Final  . Hemoglobin A1C  11/09/2013 6.4* 4.8 - 5.6 % Final   Comment:          Increased risk for diabetes: 5.7 - 6.4                                   Diabetes: >6.4                                   Glycemic control for adults with diabetes: <7.0  . Estimated average glucose 11/09/2013 137   Final     Glucose 83, A1c 6.1%, triglycerides 193, HDL 33 and LDL 71, normal liver functions, date of test 6/16     Medication List       This list is accurate as of: 01/10/14  8:30 AM.  Always use your most recent med list.               amoxicillin-clavulanate 875-125 MG per tablet  Commonly known as:  AUGMENTIN     aspirin 325 MG tablet  Take 325 mg by mouth daily.     atorvastatin 20 MG tablet  Commonly known as:  LIPITOR  Take 1 tablet (20 mg total) by mouth daily.     azithromycin 500 MG tablet  Commonly known as:  ZITHROMAX     buPROPion 150 MG 24 hr tablet  Commonly known as:  WELLBUTRIN XL  Take 150 mg by mouth 3 (three) times daily.     calcium citrate-vitamin D 315-200 MG-UNIT per tablet  Commonly known as:  CITRACAL+D  Take 1 tablet by mouth 2 (two) times daily.     gabapentin 400 MG capsule  Commonly known as:  NEURONTIN  Take 400 mg by mouth 2 (two) times daily.     glucose blood test strip  Commonly known as:  ONE TOUCH ULTRA TEST  Use as instructed to check blood sugar 2 times per day dx code 250.02     HYDROcodone-acetaminophen 5-325 MG per tablet  Commonly known as:  NORCO/VICODIN     LORazepam  1 MG tablet  Commonly known as:  ATIVAN  1 mg.     metFORMIN 1000 MG tablet  Commonly known as:  GLUCOPHAGE  Take 1 tablet (1,000 mg total) by mouth 2 (two) times daily with a meal. 1 tablet orally twice a day     mirtazapine 7.5 MG tablet  Commonly known as:  REMERON  Take 2 mg by mouth at bedtime.     omeprazole 20 MG capsule  Commonly known as:  PRILOSEC  20 mg.     ONETOUCH DELICA LANCETS FINE Misc  by Does not apply route. Use twice a day     oxybutynin 15 MG 24 hr tablet  Commonly known as:  DITROPAN XL  Take 15 mg by mouth daily.     oxycodone 5 MG capsule  Commonly known as:  OXY-IR  Take 5 mg by mouth every 4 (four) hours as needed (2 tablets).     risperiDONE 2 MG tablet  Commonly known as:  RISPERDAL  Take 2 mg by mouth once.     sertraline 100 MG tablet  Commonly known as:  ZOLOFT  Take 100 mg by mouth daily. 2 tablets by mouth daily     SUMAtriptan 100 MG tablet  Commonly known as:  IMITREX  Take 100 mg by mouth every  4 (four) hours as needed for migraine.     VENTOLIN HFA 108 (90 BASE) MCG/ACT inhaler  Generic drug:  albuterol     VICTOZA 18 MG/3ML Sopn  Generic drug:  Liraglutide  Inject 1.8 mg into the skin.        No past medical history on file.  No past surgical history on file.  No family history on file.  Social History:  reports that she has never smoked. She does not have any smokeless tobacco history on file. Her alcohol and drug histories are not on file.  Allergies:  Allergies  Allergen Reactions  . Levaquin [Levofloxacin In D5w] Itching     Review of systems:    HYPERLIPIDEMIA: She has had high LDL, high triglycerides as also LDL particle number  and low HDL.   She has resumed taking her OTC Slo-niacin dose 1000 mg a day since 11/14 and HDL is improved Her LDL has been at target but triglycerides are still relatively high  She has had problems with depression which is long-standing   Has not had any recent abnormality  of liver functions which was due to obstructive biliary disease. She has been treated at [facility] with biliary drainage tubes   Examination:   BP 126/82  Pulse 124  Temp(Src) 97.8 F (36.6 C)  Resp 16  Ht 5\' 4"  (1.626 m)  Wt 193 lb 9.6 oz (87.816 kg)  BMI 33.21 kg/m2  SpO2 93%  Body mass index is 33.21 kg/(m^2).    Assesment:   Diabetes type 2,:   The patient's diabetes control appears to be overall fair with high normal A1c  Again and only occasional postprandial hyperglycemia with stopping her Victoza. She can do a little better with diet and avoiding drinks with sugar consistently Also has not been able to resume her walking program as yet However has not gained any weight recently   HYPERLIPIDEMIA: Her HDL is still relatively low at 38 but is improved    She  will continue taking  her niacin twice a day as directed and get lipoprotein panel on her next visit    PLAN:  Because of her symptoms of shakiness when blood sugar is low normal she can try taking 500 mg metformin in the morning and 1500 in the evening  Encouraged her to start exercising when she is able to  More blood sugars after meals and more consistent diet   Kristin Olson 01/10/2014, 8:30 AM

## 2014-01-25 ENCOUNTER — Telehealth: Payer: Self-pay | Admitting: Endocrinology

## 2014-01-25 ENCOUNTER — Other Ambulatory Visit: Payer: Self-pay | Admitting: *Deleted

## 2014-01-25 MED ORDER — ONETOUCH DELICA LANCETS FINE MISC: Status: DC

## 2014-01-25 MED ORDER — GLUCOSE BLOOD VI STRP: ORAL_STRIP | Status: DC

## 2014-01-25 NOTE — Telephone Encounter (Signed)
Pt states that lancets and strips to [facility] [place] were not sent in    Thank You :)

## 2014-01-25 NOTE — Telephone Encounter (Signed)
They were sent in on the 23rd of March, waiting for signature and will refax

## 2014-01-28 ENCOUNTER — Other Ambulatory Visit: Payer: Self-pay | Admitting: *Deleted

## 2014-01-28 ENCOUNTER — Telehealth: Payer: Self-pay | Admitting: Endocrinology

## 2014-01-28 MED ORDER — GLUCOSE BLOOD VI STRP: ORAL_STRIP | Status: DC

## 2014-01-28 NOTE — Telephone Encounter (Signed)
Pt called and was supposed to get lancets and strips for her One touch ultra  When she went to pharmacy only the lancets were there; no strips  [facility] [place]  Thank You :)

## 2014-01-28 NOTE — Telephone Encounter (Signed)
rx was faxed on 4/7, refaxed again today!!!

## 2014-02-02 ENCOUNTER — Telehealth: Payer: Self-pay | Admitting: Endocrinology

## 2014-02-02 ENCOUNTER — Other Ambulatory Visit: Payer: Self-pay | Admitting: *Deleted

## 2014-02-02 NOTE — Telephone Encounter (Signed)
Pt states she has not recvd her sticks   [facility] [place]  Call back: [phone]  Thank You :)

## 2014-03-29 ENCOUNTER — Other Ambulatory Visit: Payer: Self-pay | Admitting: Endocrinology

## 2014-04-13 ENCOUNTER — Ambulatory Visit: Payer: Medicare Other | Admitting: Endocrinology

## 2014-04-21 ENCOUNTER — Ambulatory Visit: Payer: Medicare Other | Admitting: Endocrinology

## 2014-04-21 LAB — COMPREHENSIVE METABOLIC PANEL
ALBUMIN: 4.1 g/dL (ref 3.6–4.8)
ALK PHOS: 98 IU/L (ref 39–117)
ALT: 23 IU/L (ref 0–32)
AST: 27 IU/L (ref 0–40)
Albumin/Globulin Ratio: 1.6 (ref 1.1–2.5)
BUN/Creatinine Ratio: 11 (ref 11–26)
BUN: 11 mg/dL (ref 8–27)
CHLORIDE: 102 mmol/L (ref 97–108)
CO2: 25 mmol/L (ref 18–29)
Calcium: 9.1 mg/dL (ref 8.7–10.3)
Creatinine, Ser: 0.97 mg/dL (ref 0.57–1.00)
GFR calc Af Amer: 73 mL/min/{1.73_m2} (ref >59)
GFR calc non Af Amer: 64 mL/min/{1.73_m2} (ref >59)
GLOBULIN, TOTAL: 2.6 g/dL (ref 1.5–4.5)
GLUCOSE: 96 mg/dL (ref 65–99)
Potassium: 4 mmol/L (ref 3.5–5.2)
Sodium: 144 mmol/L (ref 134–144)
TOTAL PROTEIN: 6.7 g/dL (ref 6.0–8.5)
Total Bilirubin: 0.2 mg/dL (ref 0.0–1.2)

## 2014-04-21 LAB — LIPOPROTEIN ANALYSIS BY NMR
HDL Particle Number: 26.8 umol/L — ABNORMAL LOW (ref >=30.5)
LDL Particle Number: 550 nmol/L (ref <1000)
LDL SIZE: 19.7 nm (ref >20.5)
LP-IR Score: 70 — ABNORMAL HIGH (ref <=45)
Small LDL Particle Number: 417 nmol/L (ref <=527)

## 2014-04-21 LAB — HEMOGLOBIN A1C
ESTIMATED AVERAGE GLUCOSE: 134 mg/dL
HEMOGLOBIN A1C: 6.3 % — AB (ref 4.8–5.6)

## 2014-05-04 ENCOUNTER — Encounter: Payer: Self-pay | Admitting: Endocrinology

## 2014-05-19 ENCOUNTER — Ambulatory Visit: Payer: Medicare Other | Admitting: Endocrinology

## 2014-06-10 ENCOUNTER — Ambulatory Visit (INDEPENDENT_AMBULATORY_CARE_PROVIDER_SITE_OTHER): Payer: Medicare Other | Admitting: Endocrinology

## 2014-06-10 ENCOUNTER — Encounter: Payer: Self-pay | Admitting: Endocrinology

## 2014-06-10 VITALS — BP 118/62 | HR 92 | Temp 98.4°F | Resp 16 | Ht 64.0 in | Wt 185.8 lb

## 2014-06-10 DIAGNOSIS — IMO0001 Reserved for inherently not codable concepts without codable children: Secondary | ICD-10-CM

## 2014-06-10 DIAGNOSIS — E1165 Type 2 diabetes mellitus with hyperglycemia: Principal | ICD-10-CM

## 2014-06-10 DIAGNOSIS — E785 Hyperlipidemia, unspecified: Secondary | ICD-10-CM

## 2014-06-10 LAB — GLUCOSE, POCT (MANUAL RESULT ENTRY): POC GLUCOSE: 129 mg/dL — AB (ref 70–99)

## 2014-06-10 NOTE — Progress Notes (Signed)
Patient ID: Kristin Olson, female   DOB: [birth year], 58 y.o.   MRN: [id number]   Reason for Appointment: Diabetes follow-up   History of Present Illness   Diagnosis: Type 2 DIABETES MELITUS, date of diagnosis: 1999     She has had mild diabetes for several years which has been usually well controlled with metformin alone. Because of somewhat higher blood sugars and weight gain she was tried on Byetta in 2006 and subsequently Victoza. Previously Victoza had helped her with portion control and weight loss.    However because she had difficulty with nausea when she was taking this it was stopped, also  could not afford this because of insurance noncoverage; she stopped taking this in 2014   She is on treatment with metformin alone, currently taking 1 g twice a day. Previously had symptoms suggestive of hypoglycemia midday but not any more Her last A1c was 6.3 about 2 months ago She still has periodic mild increase in glucose but is not checking very often  Oral hypoglycemic drugs: Metformin 1 g twice a day        Side effects from medications: None Monitors blood glucose:  infrequently     Glucometer: One Touch.    Results by recall: Am: 110, PC 140     Hypoglycemia: None      Meals: 3 meals per day. she is usually controlling drinks with sugar except this morning         Wt Readings from Last 3 Encounters:  06/10/14 185 lb 12.8 oz (84.278 kg)  01/10/14 193 lb 9.6 oz (87.816 kg)  08/26/13 195 lb 3.2 oz (88.542 kg)   Physical activity: exercise: some walking                Complications are: None      Lab Results  Component Value Date   HGBA1C 6.3* 03/29/2014   HGBA1C 6.4* 11/09/2013   HGBA1C 6.3* 08/03/2013   Lab Results  Component Value Date   LDLCALC 73 11/09/2013   CREATININE 0.97 03/29/2014    Office Visit on 06/10/2014  Component Date Value Ref Range Status  . POC Glucose 06/10/2014 129* 70 - 99 mg/dl Final         Medication List       This list is accurate as  of: 06/10/14 10:53 AM.  Always use your most recent med list.               amoxicillin-clavulanate 875-125 MG per tablet  Commonly known as:  AUGMENTIN     aspirin 325 MG tablet  Take 325 mg by mouth daily.     atorvastatin 20 MG tablet  Commonly known as:  LIPITOR  Take 1 tablet (20 mg total) by mouth daily.     azithromycin 500 MG tablet  Commonly known as:  ZITHROMAX     buPROPion 150 MG 24 hr tablet  Commonly known as:  WELLBUTRIN XL  Take 150 mg by mouth 3 (three) times daily.     calcium citrate-vitamin D 315-200 MG-UNIT per tablet  Commonly known as:  CITRACAL+D  Take 1 tablet by mouth 2 (two) times daily.     gabapentin 400 MG capsule  Commonly known as:  NEURONTIN  Take 400 mg by mouth 2 (two) times daily.     glucose blood test strip  Commonly known as:  ONE TOUCH ULTRA TEST  Use as instructed to check blood sugar 2 times per day dx code 250.02  HYDROcodone-acetaminophen 5-325 MG per tablet  Commonly known as:  NORCO/VICODIN     LORazepam 1 MG tablet  Commonly known as:  ATIVAN  1 mg.     metFORMIN 1000 MG tablet  Commonly known as:  GLUCOPHAGE  Take 1 tablet (1,000 mg total) by mouth 2 (two) times daily with a meal. 1 tablet orally twice a day     mirtazapine 7.5 MG tablet  Commonly known as:  REMERON  Take 2 mg by mouth at bedtime.     omeprazole 20 MG capsule  Commonly known as:  PRILOSEC  20 mg.     ONETOUCH DELICA LANCETS FINE Misc  Use twice a day dx code 250.02     oxybutynin 15 MG 24 hr tablet  Commonly known as:  DITROPAN XL  Take 15 mg by mouth daily.     oxycodone 5 MG capsule  Commonly known as:  OXY-IR  Take 5 mg by mouth every 4 (four) hours as needed (2 tablets).     risperiDONE 2 MG tablet  Commonly known as:  RISPERDAL  Take 2 mg by mouth once.     sertraline 100 MG tablet  Commonly known as:  ZOLOFT  Take 100 mg by mouth daily. 2 tablets by mouth daily     SUMAtriptan 100 MG tablet  Commonly known as:  IMITREX   Take 100 mg by mouth every 4 (four) hours as needed for migraine.     VENTOLIN HFA 108 (90 BASE) MCG/ACT inhaler  Generic drug:  albuterol        No past medical history on file.  No past surgical history on file.  No family history on file.  Social History:  reports that she has never smoked. She does not have any smokeless tobacco history on file. Her alcohol and drug histories are not on file.  Allergies:  Allergies  Allergen Reactions  . Levaquin [Levofloxacin In D5w] Itching     Review of systems:  HYPERLIPIDEMIA: She has had high LDL, high triglycerides as also LDL particle number  and low HDL.   She has been taking niacin and not the brand name suggested of Slo-niacin dose 1000 mg a day However her particle number is excellent at 555 Her LDL has been at target but triglycerides tend to be relatively high  Lab Results  Component Value Date   CHOL 130 08/03/2013   HDL 38* 11/09/2013   LDLCALC 73 11/09/2013   TRIG 243* 11/09/2013   CHOLHDL 4.2 11/09/2013    She has had problems with depression which is long-standing    Has not had any recent abnormality of liver functions which was due to obstructive biliary disease.  [facility]   Has recent problems with hemorrhoids treated with banding   Examination:   BP 118/62  Pulse 92  Temp(Src) 98.4 F (36.9 C)  Resp 16  Ht 5\' 4"  (1.626 m)  Wt 185 lb 12.8 oz (84.278 kg)  BMI 31.88 kg/m2  SpO2 93%  Body mass index is 31.88 kg/(m^2).    Assesment/PLAN:  Diabetes type 2 with good control   The patient's diabetes control appears to be overall fair with high normal A1c  in June She has had good control with metformin alone and also no symptoms suggestive of hypoglycemia and before She is trying to be better with her diet and is maintaining her weight However has not been able to be regular with her walking program and encouraged  her to do so   However has not gained any weight recently    HYPERLIPIDEMIA: Her LDL particle number is excellent, has benefited from keeping her weight down and diet control She  will continue taking  her niacin twice a day  but switch to Slo niacin brand.     Elliotte Marsalis 06/10/2014, 10:53 AM

## 2014-06-10 NOTE — Patient Instructions (Signed)
Slo-Niacin at night

## 2014-09-28 ENCOUNTER — Other Ambulatory Visit: Payer: Self-pay | Admitting: Endocrinology

## 2014-09-29 LAB — COMPREHENSIVE METABOLIC PANEL
ALT: 33 IU/L — AB (ref 0–32)
AST: 72 IU/L — AB (ref 0–40)
Albumin/Globulin Ratio: 1.2 (ref 1.1–2.5)
Albumin: 3.9 g/dL (ref 3.6–4.8)
Alkaline Phosphatase: 689 IU/L — ABNORMAL HIGH (ref 39–117)
BUN/Creatinine Ratio: 14 (ref 11–26)
BUN: 10 mg/dL (ref 8–27)
CALCIUM: 9.4 mg/dL (ref 8.7–10.3)
CO2: 23 mmol/L (ref 18–29)
Chloride: 102 mmol/L (ref 97–108)
Creatinine, Ser: 0.73 mg/dL (ref 0.57–1.00)
GFR calc non Af Amer: 90 mL/min/{1.73_m2} (ref >59)
GFR, EST AFRICAN AMERICAN: 104 mL/min/{1.73_m2} (ref >59)
Globulin, Total: 3.2 g/dL (ref 1.5–4.5)
Glucose: 115 mg/dL — ABNORMAL HIGH (ref 65–99)
POTASSIUM: 4.1 mmol/L (ref 3.5–5.2)
SODIUM: 138 mmol/L (ref 134–144)
Total Bilirubin: 1.2 mg/dL (ref 0.0–1.2)
Total Protein: 7.1 g/dL (ref 6.0–8.5)

## 2014-09-29 LAB — LIPID PANEL
CHOLESTEROL TOTAL: 148 mg/dL (ref 100–199)
Chol/HDL Ratio: 7 ratio units — ABNORMAL HIGH (ref 0.0–4.4)
HDL: 21 mg/dL — ABNORMAL LOW (ref >39)
LDL Calculated: 87 mg/dL (ref 0–99)
Triglycerides: 202 mg/dL — ABNORMAL HIGH (ref 0–149)
VLDL CHOLESTEROL CAL: 40 mg/dL (ref 5–40)

## 2014-09-29 LAB — HEMOGLOBIN A1C
Est. average glucose Bld gHb Est-mCnc: 114 mg/dL
HEMOGLOBIN A1C: 5.6 % (ref 4.8–5.6)

## 2014-10-04 ENCOUNTER — Telehealth: Payer: Self-pay | Admitting: Endocrinology

## 2014-10-04 NOTE — Telephone Encounter (Signed)
Patient is calling for her lab results.  

## 2014-10-09 DIAGNOSIS — K831 Obstruction of bile duct: Secondary | ICD-10-CM | POA: Insufficient documentation

## 2014-10-10 ENCOUNTER — Ambulatory Visit: Payer: Medicare Other | Admitting: Endocrinology

## 2014-11-21 ENCOUNTER — Ambulatory Visit: Payer: Self-pay | Admitting: Endocrinology

## 2014-12-21 ENCOUNTER — Ambulatory Visit: Payer: Self-pay | Admitting: Endocrinology

## 2015-01-09 ENCOUNTER — Telehealth: Payer: Self-pay | Admitting: Endocrinology

## 2015-01-09 ENCOUNTER — Other Ambulatory Visit: Payer: Self-pay | Admitting: *Deleted

## 2015-01-09 MED ORDER — ONETOUCH DELICA LANCETS FINE MISC: Status: DC

## 2015-01-09 MED ORDER — GLUCOSE BLOOD VI STRP: ORAL_STRIP | Status: DC

## 2015-01-09 MED ORDER — METFORMIN HCL 1000 MG PO TABS: 1000.0000 mg | ORAL_TABLET | Freq: Two times a day (BID) | ORAL | Status: DC

## 2015-01-09 MED ORDER — ATORVASTATIN CALCIUM 20 MG PO TABS
20.0000 mg | ORAL_TABLET | Freq: Every day | ORAL | Status: DC
Start: 2015-01-09 — End: 2015-10-24

## 2015-01-09 NOTE — Telephone Encounter (Signed)
Patient called and would like a refill on her medication   Rx: Lipitior and Metformin [facility]  Lancets and test strips [facility] [place]     Thank you

## 2015-01-16 ENCOUNTER — Telehealth: Payer: Self-pay | Admitting: Endocrinology

## 2015-01-16 NOTE — Telephone Encounter (Signed)
Orders faxed

## 2015-01-16 NOTE — Telephone Encounter (Signed)
[facility], F#:[phone]

## 2015-01-24 ENCOUNTER — Ambulatory Visit: Payer: Self-pay | Admitting: Endocrinology

## 2015-02-06 ENCOUNTER — Encounter: Payer: Self-pay | Admitting: Endocrinology

## 2015-02-06 ENCOUNTER — Ambulatory Visit (INDEPENDENT_AMBULATORY_CARE_PROVIDER_SITE_OTHER): Payer: Medicare Other | Admitting: Endocrinology

## 2015-02-06 VITALS — BP 102/62 | HR 102 | Temp 97.9°F | Resp 14 | Ht 64.0 in | Wt 147.6 lb

## 2015-02-06 DIAGNOSIS — E119 Type 2 diabetes mellitus without complications: Secondary | ICD-10-CM

## 2015-02-06 DIAGNOSIS — E782 Mixed hyperlipidemia: Secondary | ICD-10-CM

## 2015-02-06 NOTE — Patient Instructions (Signed)
Stop Metformin and Lipitor

## 2015-02-06 NOTE — Progress Notes (Signed)
Patient ID: Kristin Olson, female   DOB: 05/12/1956, 59 y.o.   MRN: [id number]   Reason for Appointment: Diabetes follow-up   History of Present Illness   Diagnosis: Type 2 DIABETES MELITUS, date of diagnosis: 1999     She has had mild diabetes for several years which has been usually well controlled with metformin alone. Because of somewhat higher blood sugars and weight gain she was tried on Byetta in 2006 and subsequently Victoza. Previously Victoza had helped her with portion control and weight loss.    However because she had difficulty with nausea when she was taking this it was stopped, also  could not afford this because of insurance noncoverage; she stopped taking this in 2014   She is on treatment with metformin alone, currently taking 1 g twice a day.  Previously had symptoms suggestive of hypoglycemia midday  She has not been seen in follow-up for several months because of her ongoing hepatic problems  Recently appears that she has lost a significant amount of weight.  Her blood sugars are in the normal range at home A1c has gone down to 5.4, previously 5.6 Her last A1c was 6.4 in 1/15 She is checking some blood sugars but mostly in the morning or midday Her appetite has decreased significantly and is not eating much lately  Oral hypoglycemic drugs: Metformin 1 g twice a day        Side effects from medications: None Monitors blood glucose:  infrequently     Glucometer: One Touch.    Results by download: Recent range 72-139 with blood sugars in the mornings and occasionally afternoon and overall average 97 Hypoglycemia: None      Meals:  with a small portions       Wt Readings from Last 3 Encounters:  02/06/15 147 lb 9.6 oz (66.951 kg)  06/10/14 185 lb 12.8 oz (84.278 kg)  01/10/14 193 lb 9.6 oz (87.816 kg)   Physical activity: exercise:  exercise bike, not doing much because of fatigue        Complications are: None      Lab Results  Component Value Date   HGBA1C 5.6 09/28/2014   HGBA1C 6.3* 03/29/2014   HGBA1C 6.4* 11/09/2013   Lab Results  Component Value Date   LDLCALC 87 09/28/2014   CREATININE 0.73 09/28/2014           Medication List       This list is accurate as of: 02/06/15  8:44 AM.  Always use your most recent med list.               amoxicillin-clavulanate 875-125 MG per tablet  Commonly known as:  AUGMENTIN     aspirin 325 MG tablet  Take 325 mg by mouth daily.     atorvastatin 20 MG tablet  Commonly known as:  LIPITOR  Take 1 tablet (20 mg total) by mouth daily.     azithromycin 500 MG tablet  Commonly known as:  ZITHROMAX     buPROPion 150 MG 24 hr tablet  Commonly known as:  WELLBUTRIN XL  Take 150 mg by mouth 3 (three) times daily.     calcium citrate-vitamin D 315-200 MG-UNIT per tablet  Commonly known as:  CITRACAL+D  Take 1 tablet by mouth 2 (two) times daily.     gabapentin 400 MG capsule  Commonly known as:  NEURONTIN  Take 400 mg by mouth 2 (two) times daily.     glucose blood test strip  Commonly known as:  ONE TOUCH ULTRA TEST  Use as instructed to check blood sugar 2 times per day dx code E11.65     HYDROcodone-acetaminophen 5-325 MG per tablet  Commonly known as:  NORCO/VICODIN     HYDROmorphone 2 MG tablet  Commonly known as:  DILAUDID  Take 2 mg by mouth every 4 (four) hours as needed for severe pain.     LORazepam 1 MG tablet  Commonly known as:  ATIVAN  1 mg.     metFORMIN 1000 MG tablet  Commonly known as:  GLUCOPHAGE  Take 1 tablet (1,000 mg total) by mouth 2 (two) times daily with a meal. 1 tablet orally twice a day     mirtazapine 7.5 MG tablet  Commonly known as:  REMERON  Take 2 mg by mouth at bedtime.     omeprazole 20 MG capsule  Commonly known as:  PRILOSEC  20 mg.     ONETOUCH DELICA LANCETS FINE Misc  Use twice a day dx code E11.65     oxybutynin 15 MG 24 hr tablet  Commonly known as:  DITROPAN XL  Take 15 mg by mouth daily.     oxycodone 5 MG  capsule  Commonly known as:  OXY-IR  Take 5 mg by mouth every 4 (four) hours as needed (2 tablets).     risperiDONE 2 MG tablet  Commonly known as:  RISPERDAL  Take 2 mg by mouth once.     sertraline 100 MG tablet  Commonly known as:  ZOLOFT  Take 100 mg by mouth daily. 2 tablets by mouth daily     SUMAtriptan 100 MG tablet  Commonly known as:  IMITREX  Take 100 mg by mouth every 4 (four) hours as needed for migraine.     VENTOLIN HFA 108 (90 BASE) MCG/ACT inhaler  Generic drug:  albuterol        No past medical history on file.  No past surgical history on file.  No family history on file.  Social History:  reports that she has never smoked. She does not have any smokeless tobacco history on file. Her alcohol and drug histories are not on file.  Allergies:  Allergies  Allergen Reactions  . Levaquin [Levofloxacin In D5w] Itching     Review of systems:  She gets full very easily when she tries to eat   HYPERLIPIDEMIA: She has had high LDL, high triglycerides as also LDL particle number  and low HDL.   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    Assesment/PLAN:  Diabetes type 2 with good control   The patient's diabetes  has been affected by her marked weight loss because of her biliary issues and marked decrease in appetite  Discussed with her that she has lost weight and her blood sugars are in the nondiabetic range currently She will stop her metformin for now  She will continue to monitor that sugars periodically   HYPERLIPIDEMIA: Her LDL is only 42 and since she is eating very little and has lost a lot of weight will temporarily stop her Lipitor  May consider restarting this with her niacin in the future because of her very low HDL   Vidyuth Belsito 02/06/2015, 8:44 AM

## 2015-04-17 ENCOUNTER — Other Ambulatory Visit: Payer: Self-pay

## 2015-05-04 ENCOUNTER — Other Ambulatory Visit: Payer: Self-pay | Admitting: *Deleted

## 2015-05-04 ENCOUNTER — Telehealth: Payer: Self-pay | Admitting: Endocrinology

## 2015-05-04 MED ORDER — METFORMIN HCL 1000 MG PO TABS: ORAL_TABLET | ORAL | Status: DC

## 2015-05-04 NOTE — Telephone Encounter (Signed)
rx sent for Metformin, Atorvastatin was stopped at last visit.

## 2015-05-04 NOTE — Telephone Encounter (Signed)
Patient called and would like for her Rx  Rx: Metformin  Atorvastatin   [facility] [place]   Thank you

## 2015-05-05 ENCOUNTER — Other Ambulatory Visit: Payer: Self-pay | Admitting: *Deleted

## 2015-05-05 ENCOUNTER — Telehealth: Payer: Self-pay | Admitting: Endocrinology

## 2015-05-05 LAB — HM DIABETES EYE EXAM

## 2015-05-05 MED ORDER — ONETOUCH DELICA LANCETS FINE MISC: Status: DC

## 2015-05-05 MED ORDER — METFORMIN HCL 1000 MG PO TABS: ORAL_TABLET | ORAL | Status: DC

## 2015-05-05 MED ORDER — GLUCOSE BLOOD VI STRP: ORAL_STRIP | Status: DC

## 2015-05-05 MED ORDER — ATORVASTATIN CALCIUM 20 MG PO TABS: 20.0000 mg | ORAL_TABLET | Freq: Every day | ORAL | Status: DC

## 2015-05-05 NOTE — Telephone Encounter (Signed)
Annasha,  Can you please call this patient and let her know I have sent her Metformin and Atorvastatin [facility]   Also can you ask her for the fax # to the [facility] she wants her lab orders sent to??

## 2015-05-05 NOTE — Telephone Encounter (Signed)
Okay to continue Lipitor

## 2015-05-05 NOTE — Telephone Encounter (Signed)
Please call pt regarding her medication  liptor , please call pt back [phone]

## 2015-05-08 ENCOUNTER — Other Ambulatory Visit: Payer: Self-pay

## 2015-05-08 NOTE — Telephone Encounter (Signed)
Made pt aware that she her Rx's have been sent [facility] and also that [facility] labs have been faxed to them.

## 2015-05-11 LAB — HEMOGLOBIN A1C: HEMOGLOBIN A1C: 5.2 % (ref 4.0–6.0)

## 2015-05-11 LAB — LIPID PANEL
HDL: 25 mg/dL — AB (ref 35–70)
LDl/HDL Ratio: 4.9
TRIGLYCERIDES: 180 mg/dL — AB (ref 40–160)

## 2015-05-11 LAB — HEPATIC FUNCTION PANEL
AST: 46 U/L — AB (ref 13–35)
Alkaline Phosphatase: 171 U/L — AB (ref 25–125)

## 2015-05-11 LAB — BASIC METABOLIC PANEL: POTASSIUM: 5.3 mmol/L (ref 3.4–5.3)

## 2015-05-29 ENCOUNTER — Other Ambulatory Visit: Payer: Self-pay | Admitting: *Deleted

## 2015-05-29 ENCOUNTER — Ambulatory Visit (INDEPENDENT_AMBULATORY_CARE_PROVIDER_SITE_OTHER): Payer: Medicare Other | Admitting: Endocrinology

## 2015-05-29 ENCOUNTER — Encounter: Payer: Self-pay | Admitting: Endocrinology

## 2015-05-29 ENCOUNTER — Encounter: Payer: Self-pay | Admitting: *Deleted

## 2015-05-29 VITALS — BP 116/66 | HR 76 | Temp 98.0°F | Resp 16 | Ht 64.0 in | Wt 160.2 lb

## 2015-05-29 DIAGNOSIS — E782 Mixed hyperlipidemia: Secondary | ICD-10-CM

## 2015-05-29 DIAGNOSIS — IMO0002 Reserved for concepts with insufficient information to code with codable children: Secondary | ICD-10-CM

## 2015-05-29 DIAGNOSIS — E1165 Type 2 diabetes mellitus with hyperglycemia: Secondary | ICD-10-CM

## 2015-05-29 DIAGNOSIS — E119 Type 2 diabetes mellitus without complications: Secondary | ICD-10-CM

## 2015-05-29 DIAGNOSIS — E785 Hyperlipidemia, unspecified: Secondary | ICD-10-CM

## 2015-05-29 MED ORDER — METFORMIN HCL 1000 MG PO TABS: ORAL_TABLET | ORAL | Status: DC

## 2015-05-29 MED ORDER — ATORVASTATIN CALCIUM 10 MG PO TABS: 10.0000 mg | ORAL_TABLET | Freq: Every day | ORAL | Status: DC

## 2015-05-29 NOTE — Progress Notes (Signed)
Patient ID: Kristin Olson, female   DOB: 03/03/1956, 59 y.o.   MRN: [id number]   Reason for Appointment: Diabetes follow-up   History of Present Illness   Diagnosis: Type 2 DIABETES MELITUS, date of diagnosis: 1999     She has had mild diabetes for several years which has been usually well controlled with metformin alone. Because of somewhat higher blood sugars and weight gain she was tried on Byetta in 2006 and subsequently Victoza. Previously Victoza had helped her with portion control and weight loss.    However because she had difficulty with nausea when she was taking this it was stopped, also  could not afford this because of insurance noncoverage; she stopped taking this in 2014   She is on treatment with metformin alone, currently taking 1 g twice a day.  Previously had symptoms suggestive of hypoglycemia midday  On her last visit in 4/16 she was told to stop metformin because of her significant weight loss and normal blood sugars However she thinks that with her appetite increasing her blood sugars went up to as high as 200 and she is back on 2 g of metformin daily She does not think she feels hypoglycemic However his check blood sugars very sporadically; recent range 96-134 with fasting of 101 and only 4 readings  Recently appears that she has lost a significant amount of weight.  Her blood sugars are in the normal range at home A1c has gone down to 5.2 in July, previously 5.6  Oral hypoglycemic drugs: Metformin 1 g twice a day        Side effects from medications: None Monitors blood glucose:  infrequently     Glucometer: One Touch.    Results as above Hypoglycemia: None      Meals:  with a small portions       Wt Readings from Last 3 Encounters:  05/29/15 160 lb 3.2 oz (72.666 kg)  02/06/15 147 lb 9.6 oz (66.951 kg)  06/10/14 185 lb 12.8 oz (84.278 kg)   Physical activity: exercise:  exercise bike        Complications are: None      Lab Results  Component Value  Date   HGBA1C 5.6 09/28/2014   HGBA1C 6.3* 03/29/2014   HGBA1C 6.4* 11/09/2013   Lab Results  Component Value Date   LDLCALC 87 09/28/2014   CREATININE 0.73 09/28/2014           Medication List       This list is accurate as of: 05/29/15  8:47 PM.  Always use your most recent med list.               aspirin 325 MG tablet  Take 325 mg by mouth daily.     atorvastatin 10 MG tablet  Commonly known as:  LIPITOR  Take 1 tablet (10 mg total) by mouth daily.     calcium citrate-vitamin D 315-200 MG-UNIT per tablet  Commonly known as:  CITRACAL+D  Take 1 tablet by mouth 2 (two) times daily.     diazepam 5 MG tablet  Commonly known as:  VALIUM  Take 5 mg by mouth 3 (three) times daily.     gabapentin 400 MG capsule  Commonly known as:  NEURONTIN  Take 400 mg by mouth 2 (two) times daily.     glucose blood test strip  Commonly known as:  ONE TOUCH ULTRA TEST  Use as instructed to check blood sugar 2 times per day dx code  E11.65     HYDROcodone-acetaminophen 5-325 MG per tablet  Commonly known as:  NORCO/VICODIN     HYDROmorphone 2 MG tablet  Commonly known as:  DILAUDID  Take 2 mg by mouth every 4 (four) hours as needed for severe pain.     LORazepam 1 MG tablet  Commonly known as:  ATIVAN  1 mg.     metFORMIN 1000 MG tablet  Commonly known as:  GLUCOPHAGE  1 tablet orally twice a day     mirtazapine 7.5 MG tablet  Commonly known as:  REMERON  Take 2 mg by mouth at bedtime.     omeprazole 20 MG capsule  Commonly known as:  PRILOSEC  20 mg.     ONETOUCH DELICA LANCETS FINE Misc  Use twice a day dx code E11.65     oxybutynin 15 MG 24 hr tablet  Commonly known as:  DITROPAN XL  Take 15 mg by mouth daily.     risperiDONE 2 MG tablet  Commonly known as:  RISPERDAL  Take 2 mg by mouth once.     sertraline 100 MG tablet  Commonly known as:  ZOLOFT  Take 100 mg by mouth daily. 2 tablets by mouth daily     SUMAtriptan 100 MG tablet  Commonly known  as:  IMITREX  Take 100 mg by mouth every 4 (four) hours as needed for migraine.     VENTOLIN HFA 108 (90 BASE) MCG/ACT inhaler  Generic drug:  albuterol        No past medical history on file.  No past surgical history on file.  Family History  Problem Relation Age of Onset  . Diabetes Mother   . Diabetes Maternal Grandfather     Social History:  reports that she has never smoked. She does not have any smokeless tobacco history on file. Her alcohol and drug histories are not on file.  Allergies:  Allergies  Allergen Reactions  . Levaquin [Levofloxacin In D5w] Itching     Review of systems:  HYPERLIPIDEMIA: She has had high LDL, high triglycerides as also LDL particle number  and low HDL.   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    Assesment/PLAN:  Diabetes type 2 with good control   The patient's diabetes has been relatively mild but A1c has been usually in the  normal  range Although she was told to work with diet and exercise alone she has gone back to metformin as she thinks her blood sugars had occasionally gone up to 200 Currently she is doing fairly well with her compliance with diet and exercise Encouraged her to continue this and avoid any further weight gain Recently not checking blood sugars very much but most of them are normal, recent lab fasting glucose 113 She will continue to monitor that sugars periodically  HYPERLIPIDEMIA: Her LDL is only 62 and triglycerides are below 200.  She can take 10 mg of Lipitor now Tends to have low HDL also Since her particle number has been excellent may not need to be on niacin now    [facility] 05/29/2015, 8:47 PM

## 2015-05-29 NOTE — Patient Instructions (Signed)
Lipitor 1/2 per day  Check blood sugars on waking up ..2 .. times a week Also check blood sugars about 2 hours after a meal and do this after different meals by rotation  Recommended blood sugar levels on waking up is 90-130 and about 2 hours after meal is 140-180 Please bring blood sugar monitor to each visit.

## 2015-05-30 ENCOUNTER — Encounter: Payer: Self-pay | Admitting: *Deleted

## 2015-06-08 ENCOUNTER — Ambulatory Visit: Payer: Medicare Other | Admitting: Endocrinology

## 2015-06-28 ENCOUNTER — Other Ambulatory Visit: Payer: Self-pay | Admitting: Endocrinology

## 2015-07-19 ENCOUNTER — Other Ambulatory Visit: Payer: Self-pay | Admitting: *Deleted

## 2015-07-19 MED ORDER — GLUCOSE BLOOD VI STRP: ORAL_STRIP | Status: DC

## 2015-09-20 ENCOUNTER — Other Ambulatory Visit: Payer: Self-pay | Admitting: Endocrinology

## 2015-09-21 LAB — LIPID PANEL
CHOL/HDL RATIO: 3.7 ratio (ref 0.0–4.4)
CHOLESTEROL TOTAL: 116 mg/dL (ref 100–199)
HDL: 31 mg/dL — ABNORMAL LOW (ref >39)
LDL CALC: 56 mg/dL (ref 0–99)
Triglycerides: 144 mg/dL (ref 0–149)
VLDL Cholesterol Cal: 29 mg/dL (ref 5–40)

## 2015-09-21 LAB — COMPREHENSIVE METABOLIC PANEL
A/G RATIO: 1.4 (ref 1.1–2.5)
ALBUMIN: 4.2 g/dL (ref 3.6–4.8)
ALK PHOS: 148 IU/L — AB (ref 39–117)
ALT: 41 IU/L — ABNORMAL HIGH (ref 0–32)
AST: 40 IU/L (ref 0–40)
BILIRUBIN TOTAL: 0.3 mg/dL (ref 0.0–1.2)
BUN / CREAT RATIO: 16 (ref 11–26)
BUN: 14 mg/dL (ref 8–27)
CO2: 25 mmol/L (ref 18–29)
CREATININE: 0.85 mg/dL (ref 0.57–1.00)
Calcium: 10.3 mg/dL (ref 8.7–10.3)
Chloride: 106 mmol/L (ref 97–106)
GFR calc Af Amer: 86 mL/min/{1.73_m2} (ref >59)
GFR calc non Af Amer: 74 mL/min/{1.73_m2} (ref >59)
GLOBULIN, TOTAL: 3.1 g/dL (ref 1.5–4.5)
Glucose: 116 mg/dL — ABNORMAL HIGH (ref 65–99)
Potassium: 5.1 mmol/L (ref 3.5–5.2)
SODIUM: 141 mmol/L (ref 136–144)
Total Protein: 7.3 g/dL (ref 6.0–8.5)

## 2015-09-21 LAB — HEMOGLOBIN A1C
ESTIMATED AVERAGE GLUCOSE: 123 mg/dL
Hgb A1c MFr Bld: 5.9 % — ABNORMAL HIGH (ref 4.8–5.6)

## 2015-09-28 ENCOUNTER — Other Ambulatory Visit: Payer: Self-pay | Admitting: *Deleted

## 2015-09-28 ENCOUNTER — Encounter: Payer: Self-pay | Admitting: Endocrinology

## 2015-09-28 ENCOUNTER — Ambulatory Visit (INDEPENDENT_AMBULATORY_CARE_PROVIDER_SITE_OTHER): Payer: Medicare Other | Admitting: Endocrinology

## 2015-09-28 VITALS — BP 118/76 | HR 85 | Temp 98.2°F | Resp 14 | Ht 64.0 in | Wt 177.6 lb

## 2015-09-28 DIAGNOSIS — E782 Mixed hyperlipidemia: Secondary | ICD-10-CM | POA: Diagnosis not present

## 2015-09-28 DIAGNOSIS — IMO0002 Reserved for concepts with insufficient information to code with codable children: Secondary | ICD-10-CM

## 2015-09-28 DIAGNOSIS — E1165 Type 2 diabetes mellitus with hyperglycemia: Secondary | ICD-10-CM

## 2015-09-28 DIAGNOSIS — E1169 Type 2 diabetes mellitus with other specified complication: Principal | ICD-10-CM

## 2015-09-28 DIAGNOSIS — E119 Type 2 diabetes mellitus without complications: Secondary | ICD-10-CM

## 2015-09-28 MED ORDER — METFORMIN HCL 1000 MG PO TABS: ORAL_TABLET | ORAL | Status: DC

## 2015-09-28 MED ORDER — ATORVASTATIN CALCIUM 10 MG PO TABS: 10.0000 mg | ORAL_TABLET | Freq: Every day | ORAL | Status: DC

## 2015-09-28 MED ORDER — GLUCOSE BLOOD VI STRP: ORAL_STRIP | Status: DC

## 2015-09-28 NOTE — Patient Instructions (Signed)
Check blood sugars on waking up 2  times a week Also check blood sugars about 2 hours after a meal and do this after different meals by rotation  Recommended blood sugar levels on waking up is 90-130 and about 2 hours after meal is 130-160  Please bring your blood sugar monitor to each visit, thank you  Restart walking or bike

## 2015-09-28 NOTE — Progress Notes (Signed)
Patient ID: Kristin Olson, female   DOB: 06-09-1956, 59 y.o.   MRN: [id number]   Reason for Appointment: Diabetes follow-up   History of Present Illness   Diagnosis: Type 2 DIABETES MELITUS, date of diagnosis: 1999     She has had mild diabetes for several years which has been usually well controlled with metformin alone. Because of somewhat higher blood sugars and weight gain she was tried on Byetta in 2006 and subsequently Victoza. Previously Victoza had helped her with portion control and weight loss.    However because she had difficulty with nausea when she was taking this it was stopped, also  could not afford this because of insurance noncoverage; she stopped taking this in 2014   She is on treatment with metformin alone, currently taking 1 g twice a day.  Previously had symptoms suggestive of hypoglycemia midday   Her weight has continued to increase, previously had decreased appetite She thinks her appetite is not excessive but she is drinking about a sixpack of regular Pepsi daily because of dry mouth She is not exercising she says she has had back pain and other issues preventing her from exercising, usually using exercise bike  Home glucose levels: [place] between 115-140 and afternoon 106-139 Has only 6 readings  A1c has increased relatively speaking to 5.9, previously 5.2  Oral hypoglycemic drugs: Metformin 1 g twice a day        Side effects from medications: None Monitors blood glucose:  infrequently     Glucometer: One Touch.    Results as above Hypoglycemia: None            Wt Readings from Last 3 Encounters:  09/28/15 177 lb 9.6 oz (80.559 kg)  05/29/15 160 lb 3.2 oz (72.666 kg)  02/06/15 147 lb 9.6 oz (66.951 kg)   Physical activity: exercise: none,  previously exercise bike        Complications are: None      Lab Results  Component Value Date   HGBA1C 5.9* 09/20/2015   HGBA1C 5.2 05/11/2015   HGBA1C 5.6 09/28/2014   Lab Results    Component Value Date   LDLCALC 56 09/20/2015   CREATININE 0.85 09/20/2015           Medication List       This list is accurate as of: 09/28/15  9:26 AM.  Always use your most recent med list.               amoxicillin-clavulanate 875-125 MG tablet  Commonly known as:  AUGMENTIN  Take 1 tablet by mouth 2 (two) times daily.     aspirin 325 MG tablet  Take 325 mg by mouth daily.     atorvastatin 10 MG tablet  Commonly known as:  LIPITOR  Take 1 tablet (10 mg total) by mouth daily.     calcium citrate-vitamin D 315-200 MG-UNIT tablet  Commonly known as:  CITRACAL+D  Take 1 tablet by mouth 2 (two) times daily.     diazepam 5 MG tablet  Commonly known as:  VALIUM  Take 5 mg by mouth 3 (three) times daily.     gabapentin 400 MG capsule  Commonly known as:  NEURONTIN  Take 400 mg by mouth 2 (two) times daily.     glucose blood test strip  Commonly known as:  ONE TOUCH ULTRA TEST  Use as instructed to check blood sugar 2 times per day dx code E11.65     HYDROcodone-acetaminophen 5-325  MG tablet  Commonly known as:  NORCO/VICODIN     HYDROmorphone 2 MG tablet  Commonly known as:  DILAUDID  Take 2 mg by mouth every 4 (four) hours as needed for severe pain.     LORazepam 1 MG tablet  Commonly known as:  ATIVAN  1 mg.     metFORMIN 1000 MG tablet  Commonly known as:  GLUCOPHAGE  1 tablet orally twice a day     metFORMIN 1000 MG tablet  Commonly known as:  GLUCOPHAGE  TAKE 1 BY MOUTH TWICE DAILY     mirtazapine 7.5 MG tablet  Commonly known as:  REMERON  Take 2 mg by mouth at bedtime.     omeprazole 20 MG capsule  Commonly known as:  PRILOSEC  20 mg.     ONETOUCH DELICA LANCETS FINE Misc  Use twice a day dx code E11.65     oxybutynin 15 MG 24 hr tablet  Commonly known as:  DITROPAN XL  Take 15 mg by mouth daily.     risperiDONE 2 MG tablet  Commonly known as:  RISPERDAL  Take 2 mg by mouth once.     sertraline 100 MG tablet  Commonly known as:   ZOLOFT  Take 100 mg by mouth daily. 2 tablets by mouth daily     SUMAtriptan 100 MG tablet  Commonly known as:  IMITREX  Take 100 mg by mouth every 4 (four) hours as needed for migraine.     VENTOLIN HFA 108 (90 BASE) MCG/ACT inhaler  Generic drug:  albuterol        No past medical history on file.  No past surgical history on file.  Family History  Problem Relation Age of Onset  . Diabetes Mother   . Diabetes Maternal Grandfather     Social History:  reports that she has never smoked. She does not have any smokeless tobacco history on file. Her alcohol and drug histories are not on file.  Allergies:  Allergies  Allergen Reactions  . Levaquin [Levofloxacin In D5w] Itching     Review of systems:  HYPERLIPIDEMIA: She has had high LDL, high triglycerides as also LDL particle number  and low HDL.   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    Assesment/PLAN:  Diabetes type 2 with good control   The patient's diabetes has been relatively mild treated with metformin only She has gained weight progressively this year  with improved appetite but also is drinking  excessive amounts of regular soft drinks Fasting readings are relatively high, highest 140  She is also not exercising  For now will have her continue metformin alone and improve her diet and exercise regimen as discussed today  HYPERLIPIDEMIA: Her LDL is controlled and triglycerides are below 150.  She can continue to take 10 mg of Lipitor now Tends to have low HDL also    Kelton Bultman 09/28/2015, 9:26 AM

## 2015-10-24 ENCOUNTER — Other Ambulatory Visit: Payer: Self-pay | Admitting: Endocrinology

## 2015-12-01 DIAGNOSIS — G4733 Obstructive sleep apnea (adult) (pediatric): Secondary | ICD-10-CM | POA: Diagnosis not present

## 2015-12-05 DIAGNOSIS — F3341 Major depressive disorder, recurrent, in partial remission: Secondary | ICD-10-CM | POA: Diagnosis not present

## 2015-12-07 DIAGNOSIS — D510 Vitamin B12 deficiency anemia due to intrinsic factor deficiency: Secondary | ICD-10-CM | POA: Diagnosis not present

## 2015-12-25 DIAGNOSIS — R1310 Dysphagia, unspecified: Secondary | ICD-10-CM | POA: Diagnosis not present

## 2015-12-25 DIAGNOSIS — K5900 Constipation, unspecified: Secondary | ICD-10-CM | POA: Diagnosis not present

## 2015-12-25 DIAGNOSIS — K219 Gastro-esophageal reflux disease without esophagitis: Secondary | ICD-10-CM | POA: Diagnosis not present

## 2016-01-02 DIAGNOSIS — F3341 Major depressive disorder, recurrent, in partial remission: Secondary | ICD-10-CM | POA: Diagnosis not present

## 2016-01-05 DIAGNOSIS — D510 Vitamin B12 deficiency anemia due to intrinsic factor deficiency: Secondary | ICD-10-CM | POA: Diagnosis not present

## 2016-01-10 DIAGNOSIS — E1165 Type 2 diabetes mellitus with hyperglycemia: Secondary | ICD-10-CM | POA: Diagnosis not present

## 2016-01-10 DIAGNOSIS — M4802 Spinal stenosis, cervical region: Secondary | ICD-10-CM | POA: Diagnosis not present

## 2016-01-10 DIAGNOSIS — M5010 Cervical disc disorder with radiculopathy, unspecified cervical region: Secondary | ICD-10-CM | POA: Diagnosis not present

## 2016-01-10 DIAGNOSIS — N3090 Cystitis, unspecified without hematuria: Secondary | ICD-10-CM | POA: Diagnosis not present

## 2016-01-15 ENCOUNTER — Other Ambulatory Visit: Payer: Self-pay | Admitting: Endocrinology

## 2016-01-15 DIAGNOSIS — E1169 Type 2 diabetes mellitus with other specified complication: Secondary | ICD-10-CM | POA: Diagnosis not present

## 2016-01-15 DIAGNOSIS — E1165 Type 2 diabetes mellitus with hyperglycemia: Secondary | ICD-10-CM | POA: Diagnosis not present

## 2016-01-16 LAB — URINALYSIS
BILIRUBIN UA: NEGATIVE
GLUCOSE, UA: NEGATIVE
KETONES UA: NEGATIVE
Nitrite, UA: NEGATIVE
Protein, UA: NEGATIVE
RBC UA: NEGATIVE
UUROB: 0.2 mg/dL (ref 0.2–1.0)
pH, UA: 5 (ref 5.0–7.5)

## 2016-01-16 LAB — LIPID PANEL
CHOL/HDL RATIO: 4.4 ratio (ref 0.0–4.4)
CHOLESTEROL TOTAL: 120 mg/dL (ref 100–199)
HDL: 27 mg/dL — ABNORMAL LOW (ref >39)
LDL Calculated: 32 mg/dL (ref 0–99)
TRIGLYCERIDES: 306 mg/dL — AB (ref 0–149)
VLDL Cholesterol Cal: 61 mg/dL — ABNORMAL HIGH (ref 5–40)

## 2016-01-16 LAB — COMPREHENSIVE METABOLIC PANEL
A/G RATIO: 1.6 (ref 1.2–2.2)
ALK PHOS: 114 IU/L (ref 39–117)
ALT: 28 IU/L (ref 0–32)
AST: 30 IU/L (ref 0–40)
Albumin: 4.2 g/dL (ref 3.6–4.8)
BILIRUBIN TOTAL: 0.3 mg/dL (ref 0.0–1.2)
BUN/Creatinine Ratio: 9 — ABNORMAL LOW (ref 11–26)
BUN: 10 mg/dL (ref 8–27)
CHLORIDE: 104 mmol/L (ref 96–106)
CO2: 24 mmol/L (ref 18–29)
Calcium: 9.2 mg/dL (ref 8.7–10.3)
Creatinine, Ser: 1.07 mg/dL — ABNORMAL HIGH (ref 0.57–1.00)
GFR calc Af Amer: 64 mL/min/{1.73_m2} (ref >59)
GFR calc non Af Amer: 56 mL/min/{1.73_m2} — ABNORMAL LOW (ref >59)
GLUCOSE: 124 mg/dL — AB (ref 65–99)
Globulin, Total: 2.7 g/dL (ref 1.5–4.5)
POTASSIUM: 4.2 mmol/L (ref 3.5–5.2)
Sodium: 140 mmol/L (ref 134–144)
Total Protein: 6.9 g/dL (ref 6.0–8.5)

## 2016-01-16 LAB — MICROALBUMIN / CREATININE URINE RATIO
Creatinine, Urine: 44.9 mg/dL
MICROALB/CREAT RATIO: 9.6 mg/g{creat} (ref 0.0–30.0)
MICROALBUM., U, RANDOM: 4.3 ug/mL

## 2016-01-16 LAB — HEMOGLOBIN A1C
Est. average glucose Bld gHb Est-mCnc: 123 mg/dL
Hgb A1c MFr Bld: 5.9 % — ABNORMAL HIGH (ref 4.8–5.6)

## 2016-01-26 ENCOUNTER — Ambulatory Visit: Payer: Medicare Other | Admitting: Endocrinology

## 2016-02-07 ENCOUNTER — Ambulatory Visit (INDEPENDENT_AMBULATORY_CARE_PROVIDER_SITE_OTHER): Payer: Medicare Other | Admitting: Endocrinology

## 2016-02-07 ENCOUNTER — Other Ambulatory Visit: Payer: Self-pay | Admitting: *Deleted

## 2016-02-07 ENCOUNTER — Encounter: Payer: Self-pay | Admitting: Endocrinology

## 2016-02-07 VITALS — BP 122/82 | HR 82 | Temp 97.6°F | Resp 14 | Ht 62.0 in | Wt 187.6 lb

## 2016-02-07 DIAGNOSIS — E118 Type 2 diabetes mellitus with unspecified complications: Principal | ICD-10-CM

## 2016-02-07 DIAGNOSIS — E785 Hyperlipidemia, unspecified: Secondary | ICD-10-CM

## 2016-02-07 DIAGNOSIS — E119 Type 2 diabetes mellitus without complications: Secondary | ICD-10-CM | POA: Diagnosis not present

## 2016-02-07 DIAGNOSIS — E1165 Type 2 diabetes mellitus with hyperglycemia: Secondary | ICD-10-CM

## 2016-02-07 DIAGNOSIS — E782 Mixed hyperlipidemia: Secondary | ICD-10-CM | POA: Diagnosis not present

## 2016-02-07 DIAGNOSIS — IMO0002 Reserved for concepts with insufficient information to code with codable children: Secondary | ICD-10-CM

## 2016-02-07 MED ORDER — ATORVASTATIN CALCIUM 10 MG PO TABS: 10.0000 mg | ORAL_TABLET | Freq: Every day | ORAL | Status: DC

## 2016-02-07 MED ORDER — GLUCOSE BLOOD VI STRP: ORAL_STRIP | Status: DC

## 2016-02-07 NOTE — Patient Instructions (Signed)
Check blood sugars on waking up  2-3 times a week Also check blood sugars about 2 hours after a meal and do this after different meals by rotation  Recommended blood sugar levels on waking up is 90-130 and about 2 hours after meal is 130-160  Please bring your blood sugar monitor to each visit, thank you  Cut back on sugars and carbs

## 2016-02-07 NOTE — Progress Notes (Signed)
Patient ID: Kristin Olson, female   DOB: 03/05/1956, 60 y.o.   MRN: [id number]   Reason for Appointment: Diabetes follow-up   History of Present Illness   Diagnosis: Type 2 DIABETES MELITUS, date of diagnosis: 1999     She has had mild diabetes for several years which has been usually well controlled with metformin alone. Because of somewhat higher blood sugars and weight gain she was tried on Byetta in 2006 and subsequently Victoza. Previously Victoza had helped her with portion control and weight loss.    However because she had difficulty with nausea when she was taking this it was stopped, also  could not afford this because of insurance noncoverage; she stopped taking this in 2014   She is on treatment with metformin alone, currently taking 1 g twice a day.  Previously had symptoms suggestive of hypoglycemia midday   Her weight has continued to increase, previously had decreased appetite She does not think her portions are large but is continuing to gain significant amount of weight. She is still not stopping her intake of regular Pepsi which she has been doing all day, about a sixpack a day She is still not exercising because of continued joint and back problems which have not been addressed Some of her high sugars are probably related to her intake of Pepsi or using instant oatmeal in the morning   Home glucose levels: Fasting glucose 117, nonfasting range 86-196 MEDIAN 123  A1c has stayed about the same at 5.9 Previously has low as 5.2  Oral hypoglycemic drugs: Metformin 1 g twice a day        Side effects from medications: None Monitors blood glucose:  infrequently     Glucometer: One Touch.    Results as above Hypoglycemia: None            Wt Readings from Last 3 Encounters:  02/07/16 187 lb 9.6 oz (85.095 kg)  09/28/15 177 lb 9.6 oz (80.559 kg)  05/29/15 160 lb 3.2 oz (72.666 kg)   Bfst inst oatmeal, has Pepsi  Physical activity: exercise: none,   previously exercise bike        Complications are: None      Lab Results  Component Value Date   HGBA1C 5.9* 01/15/2016   HGBA1C 5.9* 09/20/2015   HGBA1C 5.2 05/11/2015   Lab Results  Component Value Date   LDLCALC 32 01/15/2016   CREATININE 1.07* 01/15/2016         Medication List       This list is accurate as of: 02/07/16 11:07 AM.  Always use your most recent med list.               atorvastatin 10 MG tablet  Commonly known as:  LIPITOR  Take 1 tablet (10 mg total) by mouth daily.     calcium citrate-vitamin D 315-200 MG-UNIT tablet  Commonly known as:  CITRACAL+D  Take 1 tablet by mouth 2 (two) times daily.     diazepam 5 MG tablet  Commonly known as:  VALIUM  Take 5 mg by mouth 3 (three) times daily.     gabapentin 400 MG capsule  Commonly known as:  NEURONTIN  Take 400 mg by mouth 2 (two) times daily.     glucose blood test strip  Commonly known as:  ONE TOUCH ULTRA TEST  Use as instructed to check blood sugar 2 times per day dx code E11.65     metFORMIN 1000 MG tablet  Commonly known as:  GLUCOPHAGE  TAKE 1 BY MOUTH TWICE DAILY     mirtazapine 7.5 MG tablet  Commonly known as:  REMERON  Take 2 mg by mouth at bedtime.     omeprazole 20 MG capsule  Commonly known as:  PRILOSEC  20 mg.     ONETOUCH DELICA LANCETS FINE Misc  Use twice a day dx code E11.65     oxybutynin 15 MG 24 hr tablet  Commonly known as:  DITROPAN XL  Take 15 mg by mouth daily.     risperiDONE 2 MG tablet  Commonly known as:  RISPERDAL  Take 2 mg by mouth once.     sertraline 100 MG tablet  Commonly known as:  ZOLOFT  Take 100 mg by mouth daily. 2 tablets by mouth daily     SUMAtriptan 100 MG tablet  Commonly known as:  IMITREX  Take 100 mg by mouth every 4 (four) hours as needed for migraine.     VENTOLIN HFA 108 (90 Base) MCG/ACT inhaler  Generic drug:  albuterol        No past medical history on file.  No past surgical history on file.  Family History   Problem Relation Age of Onset  . Diabetes Mother   . Diabetes Maternal Grandfather     Social History:  reports that she has never smoked. She does not have any smokeless tobacco history on file. Her alcohol and drug histories are not on file.  Allergies:  Allergies  Allergen Reactions  . Levaquin [Levofloxacin In D5w] Itching     Review of systems:  HYPERLIPIDEMIA: She has had high LDL, high triglycerides as also LDL particle number  and low HDL.   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    Assesment/PLAN:  Diabetes type 2 with reasonably good control   The patient's diabetes has been relatively mild and treated with metformin only She has gained weight progressively  Partly this is related to inactivity and also consuming excessive carbohydrates and simple sugars She has finally decided to stop drinking her Pepsi Discussed trying to eat balanced meals and reduce carbohydrate especially at breakfast  She will discuss her musculoskeletal problems with her PCP so that she can start exercising  HYPERLIPIDEMIA: Her LDL is controlled and triglycerides are higher, may improve with weight  loss and cutting back on carbohydrates Consider fenofibrate if not improved   Jakwan Sally 02/07/2016, 11:07 AM

## 2016-02-08 DIAGNOSIS — E1165 Type 2 diabetes mellitus with hyperglycemia: Secondary | ICD-10-CM | POA: Diagnosis not present

## 2016-02-22 DIAGNOSIS — J209 Acute bronchitis, unspecified: Secondary | ICD-10-CM | POA: Diagnosis not present

## 2016-02-22 DIAGNOSIS — J069 Acute upper respiratory infection, unspecified: Secondary | ICD-10-CM | POA: Diagnosis not present

## 2016-02-26 DIAGNOSIS — D510 Vitamin B12 deficiency anemia due to intrinsic factor deficiency: Secondary | ICD-10-CM | POA: Diagnosis not present

## 2016-02-28 DIAGNOSIS — K921 Melena: Secondary | ICD-10-CM | POA: Diagnosis not present

## 2016-03-03 DIAGNOSIS — G4733 Obstructive sleep apnea (adult) (pediatric): Secondary | ICD-10-CM | POA: Diagnosis not present

## 2016-03-07 DIAGNOSIS — E042 Nontoxic multinodular goiter: Secondary | ICD-10-CM | POA: Diagnosis not present

## 2016-03-11 DIAGNOSIS — K5900 Constipation, unspecified: Secondary | ICD-10-CM | POA: Diagnosis not present

## 2016-03-11 DIAGNOSIS — K6289 Other specified diseases of anus and rectum: Secondary | ICD-10-CM | POA: Diagnosis not present

## 2016-03-15 DIAGNOSIS — M879 Osteonecrosis, unspecified: Secondary | ICD-10-CM | POA: Diagnosis not present

## 2016-03-15 DIAGNOSIS — K5730 Diverticulosis of large intestine without perforation or abscess without bleeding: Secondary | ICD-10-CM | POA: Diagnosis not present

## 2016-03-15 DIAGNOSIS — K6289 Other specified diseases of anus and rectum: Secondary | ICD-10-CM | POA: Diagnosis not present

## 2016-03-15 DIAGNOSIS — R109 Unspecified abdominal pain: Secondary | ICD-10-CM | POA: Diagnosis not present

## 2016-03-15 DIAGNOSIS — R102 Pelvic and perineal pain: Secondary | ICD-10-CM | POA: Diagnosis not present

## 2016-03-22 DIAGNOSIS — I10 Essential (primary) hypertension: Secondary | ICD-10-CM | POA: Diagnosis not present

## 2016-03-22 DIAGNOSIS — J45909 Unspecified asthma, uncomplicated: Secondary | ICD-10-CM | POA: Diagnosis not present

## 2016-03-22 DIAGNOSIS — F329 Major depressive disorder, single episode, unspecified: Secondary | ICD-10-CM | POA: Diagnosis not present

## 2016-03-22 DIAGNOSIS — E119 Type 2 diabetes mellitus without complications: Secondary | ICD-10-CM | POA: Diagnosis not present

## 2016-03-22 DIAGNOSIS — R6889 Other general symptoms and signs: Secondary | ICD-10-CM | POA: Diagnosis not present

## 2016-03-22 DIAGNOSIS — E785 Hyperlipidemia, unspecified: Secondary | ICD-10-CM | POA: Diagnosis not present

## 2016-03-22 DIAGNOSIS — F419 Anxiety disorder, unspecified: Secondary | ICD-10-CM | POA: Diagnosis not present

## 2016-03-22 DIAGNOSIS — G43909 Migraine, unspecified, not intractable, without status migrainosus: Secondary | ICD-10-CM | POA: Diagnosis not present

## 2016-03-22 DIAGNOSIS — Z86010 Personal history of colonic polyps: Secondary | ICD-10-CM | POA: Diagnosis not present

## 2016-03-22 DIAGNOSIS — K5730 Diverticulosis of large intestine without perforation or abscess without bleeding: Secondary | ICD-10-CM | POA: Diagnosis not present

## 2016-03-22 DIAGNOSIS — K6289 Other specified diseases of anus and rectum: Secondary | ICD-10-CM | POA: Diagnosis not present

## 2016-03-27 DIAGNOSIS — K6289 Other specified diseases of anus and rectum: Secondary | ICD-10-CM | POA: Diagnosis not present

## 2016-03-27 DIAGNOSIS — K921 Melena: Secondary | ICD-10-CM | POA: Diagnosis not present

## 2016-03-28 DIAGNOSIS — D510 Vitamin B12 deficiency anemia due to intrinsic factor deficiency: Secondary | ICD-10-CM | POA: Diagnosis not present

## 2016-04-18 DIAGNOSIS — K5900 Constipation, unspecified: Secondary | ICD-10-CM | POA: Diagnosis not present

## 2016-04-18 DIAGNOSIS — K6289 Other specified diseases of anus and rectum: Secondary | ICD-10-CM | POA: Diagnosis not present

## 2016-04-21 DIAGNOSIS — N3001 Acute cystitis with hematuria: Secondary | ICD-10-CM | POA: Diagnosis not present

## 2016-04-21 DIAGNOSIS — J029 Acute pharyngitis, unspecified: Secondary | ICD-10-CM | POA: Diagnosis not present

## 2016-04-21 DIAGNOSIS — N3000 Acute cystitis without hematuria: Secondary | ICD-10-CM | POA: Diagnosis not present

## 2016-04-25 DIAGNOSIS — J342 Deviated nasal septum: Secondary | ICD-10-CM | POA: Diagnosis not present

## 2016-04-25 DIAGNOSIS — E042 Nontoxic multinodular goiter: Secondary | ICD-10-CM | POA: Diagnosis not present

## 2016-04-25 DIAGNOSIS — R0981 Nasal congestion: Secondary | ICD-10-CM | POA: Diagnosis not present

## 2016-04-25 DIAGNOSIS — J3489 Other specified disorders of nose and nasal sinuses: Secondary | ICD-10-CM | POA: Diagnosis not present

## 2016-04-29 ENCOUNTER — Other Ambulatory Visit: Payer: Self-pay

## 2016-04-29 MED ORDER — METFORMIN HCL 1000 MG PO TABS: ORAL_TABLET | ORAL | Status: DC

## 2016-04-30 DIAGNOSIS — N3281 Overactive bladder: Secondary | ICD-10-CM | POA: Diagnosis not present

## 2016-04-30 DIAGNOSIS — N3020 Other chronic cystitis without hematuria: Secondary | ICD-10-CM | POA: Diagnosis not present

## 2016-05-02 DIAGNOSIS — E538 Deficiency of other specified B group vitamins: Secondary | ICD-10-CM | POA: Diagnosis not present

## 2016-05-02 DIAGNOSIS — F3341 Major depressive disorder, recurrent, in partial remission: Secondary | ICD-10-CM | POA: Diagnosis not present

## 2016-05-13 ENCOUNTER — Telehealth: Payer: Self-pay | Admitting: Endocrinology

## 2016-05-13 ENCOUNTER — Other Ambulatory Visit: Payer: Self-pay

## 2016-05-13 MED ORDER — METFORMIN HCL 1000 MG PO TABS: ORAL_TABLET | ORAL | 1 refills | Status: DC

## 2016-05-13 NOTE — Telephone Encounter (Signed)
PT needs Metformin refilled for 90 days supply sent to [facility]

## 2016-05-15 DIAGNOSIS — F3341 Major depressive disorder, recurrent, in partial remission: Secondary | ICD-10-CM | POA: Diagnosis not present

## 2016-05-24 ENCOUNTER — Other Ambulatory Visit: Payer: Self-pay | Admitting: Endocrinology

## 2016-05-24 DIAGNOSIS — E1165 Type 2 diabetes mellitus with hyperglycemia: Secondary | ICD-10-CM | POA: Diagnosis not present

## 2016-05-24 DIAGNOSIS — E538 Deficiency of other specified B group vitamins: Secondary | ICD-10-CM | POA: Diagnosis not present

## 2016-05-24 DIAGNOSIS — E785 Hyperlipidemia, unspecified: Secondary | ICD-10-CM | POA: Diagnosis not present

## 2016-05-24 DIAGNOSIS — E118 Type 2 diabetes mellitus with unspecified complications: Secondary | ICD-10-CM | POA: Diagnosis not present

## 2016-05-25 LAB — COMPREHENSIVE METABOLIC PANEL
ALK PHOS: 130 IU/L — AB (ref 39–117)
ALT: 29 IU/L (ref 0–32)
AST: 31 IU/L (ref 0–40)
Albumin/Globulin Ratio: 1.4 (ref 1.2–2.2)
Albumin: 4.2 g/dL (ref 3.6–4.8)
BILIRUBIN TOTAL: 0.4 mg/dL (ref 0.0–1.2)
BUN/Creatinine Ratio: 8 — ABNORMAL LOW (ref 12–28)
BUN: 10 mg/dL (ref 8–27)
CHLORIDE: 101 mmol/L (ref 96–106)
CO2: 21 mmol/L (ref 18–29)
Calcium: 9.4 mg/dL (ref 8.7–10.3)
Creatinine, Ser: 1.18 mg/dL — ABNORMAL HIGH (ref 0.57–1.00)
GFR calc Af Amer: 57 mL/min/{1.73_m2} — ABNORMAL LOW (ref >59)
GFR calc non Af Amer: 50 mL/min/{1.73_m2} — ABNORMAL LOW (ref >59)
GLUCOSE: 104 mg/dL — AB (ref 65–99)
Globulin, Total: 2.9 g/dL (ref 1.5–4.5)
Potassium: 4.4 mmol/L (ref 3.5–5.2)
Sodium: 141 mmol/L (ref 134–144)
Total Protein: 7.1 g/dL (ref 6.0–8.5)

## 2016-05-25 LAB — LIPID PANEL
CHOLESTEROL TOTAL: 120 mg/dL (ref 100–199)
Chol/HDL Ratio: 3.6 ratio units (ref 0.0–4.4)
HDL: 33 mg/dL — AB (ref >39)
LDL Calculated: 55 mg/dL (ref 0–99)
Triglycerides: 159 mg/dL — ABNORMAL HIGH (ref 0–149)
VLDL CHOLESTEROL CAL: 32 mg/dL (ref 5–40)

## 2016-05-25 LAB — TSH: TSH: 2.01 u[IU]/mL (ref 0.450–4.500)

## 2016-05-25 LAB — HEMOGLOBIN A1C
ESTIMATED AVERAGE GLUCOSE: 105 mg/dL
Hgb A1c MFr Bld: 5.3 % (ref 4.8–5.6)

## 2016-05-27 DIAGNOSIS — K5900 Constipation, unspecified: Secondary | ICD-10-CM | POA: Diagnosis not present

## 2016-05-27 DIAGNOSIS — K6289 Other specified diseases of anus and rectum: Secondary | ICD-10-CM | POA: Diagnosis not present

## 2016-05-28 DIAGNOSIS — Z1231 Encounter for screening mammogram for malignant neoplasm of breast: Secondary | ICD-10-CM | POA: Diagnosis not present

## 2016-05-28 DIAGNOSIS — R928 Other abnormal and inconclusive findings on diagnostic imaging of breast: Secondary | ICD-10-CM | POA: Diagnosis not present

## 2016-05-29 DIAGNOSIS — F3341 Major depressive disorder, recurrent, in partial remission: Secondary | ICD-10-CM | POA: Diagnosis not present

## 2016-06-04 DIAGNOSIS — G4733 Obstructive sleep apnea (adult) (pediatric): Secondary | ICD-10-CM | POA: Diagnosis not present

## 2016-06-07 ENCOUNTER — Ambulatory Visit: Payer: Medicare Other | Admitting: Endocrinology

## 2016-06-11 DIAGNOSIS — E119 Type 2 diabetes mellitus without complications: Secondary | ICD-10-CM | POA: Diagnosis not present

## 2016-06-12 DIAGNOSIS — N6489 Other specified disorders of breast: Secondary | ICD-10-CM | POA: Diagnosis not present

## 2016-06-12 DIAGNOSIS — R928 Other abnormal and inconclusive findings on diagnostic imaging of breast: Secondary | ICD-10-CM | POA: Diagnosis not present

## 2016-06-12 LAB — HM DIABETES EYE EXAM

## 2016-06-17 ENCOUNTER — Encounter: Payer: Self-pay | Admitting: Endocrinology

## 2016-06-17 ENCOUNTER — Ambulatory Visit (INDEPENDENT_AMBULATORY_CARE_PROVIDER_SITE_OTHER): Payer: Medicare Other | Admitting: Endocrinology

## 2016-06-17 ENCOUNTER — Other Ambulatory Visit: Payer: Self-pay

## 2016-06-17 VITALS — BP 118/80 | HR 107 | Ht 64.0 in | Wt 185.0 lb

## 2016-06-17 DIAGNOSIS — E1310 Other specified diabetes mellitus with ketoacidosis without coma: Secondary | ICD-10-CM

## 2016-06-17 DIAGNOSIS — E1165 Type 2 diabetes mellitus with hyperglycemia: Secondary | ICD-10-CM | POA: Diagnosis not present

## 2016-06-17 DIAGNOSIS — E782 Mixed hyperlipidemia: Secondary | ICD-10-CM | POA: Diagnosis not present

## 2016-06-17 DIAGNOSIS — E119 Type 2 diabetes mellitus without complications: Secondary | ICD-10-CM | POA: Diagnosis not present

## 2016-06-17 MED ORDER — ONETOUCH DELICA LANCETS FINE MISC: 3 refills | Status: DC

## 2016-06-17 MED ORDER — GLUCOSE BLOOD VI STRP: ORAL_STRIP | 3 refills | Status: DC

## 2016-06-17 NOTE — Progress Notes (Signed)
Patient ID: Kristin Olson, female   DOB: 05-Jun-1956, 60 y.o.   MRN: [id number]   Reason for Appointment: Diabetes follow-up   History of Present Illness   Diagnosis: Type 2 DIABETES MELITUS, date of diagnosis: 1999     She has had mild diabetes for several years which has been usually well controlled with metformin alone. Because of somewhat higher blood sugars and weight gain she was tried on Byetta in 2006 and subsequently Victoza. Previously Victoza had helped her with portion control and weight loss.    However because she had difficulty with nausea when she was taking this it was stopped, also  could not afford this because of insurance noncoverage; she stopped taking this in 2014   She is on treatment with metformin alone, currently taking 1 g twice a day.   Current management:  She is doing better overall with her diet and her weight has leveled off  She has not had any symptoms of hypoglycemia which she reported previously with blood sugars apparently below 50 at lunch or supper.  However she tries to have some snacks in between meals  She is drinking Ensure again since she was started a protein level was low previously  She has started some exercise at home recently  Lab fasting glucose is 104  Home glucose levels: By recall the range is  95-130  A1c has stayed about the same at 5.9 Previously has low as 5.2  Oral hypoglycemic drugs: Metformin 1 g twice a day        Side effects from medications: None Monitors blood glucose:  infrequently     Glucometer: One Touch.    Results as above Hypoglycemia: None            Wt Readings from Last 3 Encounters:  06/17/16 185 lb (83.9 kg)  02/07/16 187 lb 9.6 oz (85.1 kg)  09/28/15 177 lb 9.6 oz (80.6 kg)   Diet: Instant oatmeal in the morning with Ensure  Physical activity: exercise: Some walking, has a gym at home also           Lab Results  Component Value Date   HGBA1C 5.3 05/24/2016   HGBA1C 5.9 (H)  01/15/2016   HGBA1C 5.9 (H) 09/20/2015   Lab Results  Component Value Date   LDLCALC 55 05/24/2016   CREATININE 1.18 (H) 05/24/2016    Microalbumin normal in 3/17     Medication List       Accurate as of 06/17/16  9:05 AM. Always use your most recent med list.          atorvastatin 10 MG tablet Commonly known as:  LIPITOR Take 1 tablet (10 mg total) by mouth daily.   calcium citrate-vitamin D 315-200 MG-UNIT tablet Commonly known as:  CITRACAL+D Take 1 tablet by mouth 2 (two) times daily.   diazepam 5 MG tablet Commonly known as:  VALIUM Take 5 mg by mouth 3 (three) times daily.   gabapentin 400 MG capsule Commonly known as:  NEURONTIN Take 400 mg by mouth 2 (two) times daily.   glucose blood test strip Commonly known as:  ONE TOUCH ULTRA TEST Use as instructed to check blood sugar 2 times per day dx code E11.65   metFORMIN 1000 MG tablet Commonly known as:  GLUCOPHAGE TAKE 1 BY MOUTH TWICE DAILY   mirtazapine 7.5 MG tablet Commonly known as:  REMERON Take 2 mg by mouth at bedtime.   omeprazole 20 MG capsule Commonly known  as:  PRILOSEC 20 mg.   ONETOUCH DELICA LANCETS FINE Misc Use twice a day dx code E11.65   oxybutynin 15 MG 24 hr tablet Commonly known as:  DITROPAN XL Take 15 mg by mouth daily.   risperiDONE 2 MG tablet Commonly known as:  RISPERDAL Take 2 mg by mouth once.   sertraline 100 MG tablet Commonly known as:  ZOLOFT Take 100 mg by mouth daily. 2 tablets by mouth daily   SUMAtriptan 100 MG tablet Commonly known as:  IMITREX Take 100 mg by mouth every 4 (four) hours as needed for migraine.   VENTOLIN HFA 108 (90 Base) MCG/ACT inhaler Generic drug:  albuterol       No past medical history on file.  No past surgical history on file.  Family History  Problem Relation Age of Onset  . Diabetes Mother   . Diabetes Maternal Grandfather     Social History:  reports that she has never smoked. She does not have any smokeless  tobacco history on file. Her alcohol and drug histories are not on file.  Allergies:  Allergies  Allergen Reactions  . Levaquin [Levofloxacin In D5w] Itching     Review of systems:  HYPERLIPIDEMIA: She has had high LDL, high triglycerides as also LDL particle number  and low HDL.   Previously her particle number was excellent at 555  She is taking Lipitor 10 mg daily and her levels are excellent Triglycerides are now better but not below 150 Currently not on niacin and HDL is improving   Lab Results  Component Value Date   CHOL 120 05/24/2016   HDL 33 (L) 05/24/2016   LDLCALC 55 05/24/2016   TRIG 159 (H) 05/24/2016   CHOLHDL 3.6 05/24/2016    She has had problems with depression which is long-standing, taking Risperdal with control   She  had several surgeries because of biliary duct obstruction She has been treated at [facility] and has normal liver functions Consistently except minimal increase in alkaline phosphatase      Examination:   BP 118/80   Pulse (!) 107   Ht 5\' 4"  (1.626 m)   Wt 185 lb (83.9 kg)   SpO2 97%   BMI 31.76 kg/m   Body mass index is 31.76 kg/m.    Assesment/PLAN:  Diabetes type 2, Mild and with good control   The patient's diabetes has been relatively mild and treated with metformin only A1c is improved at 5.3 She is able to start exercising and overall trying to improve her diet  Since she reports tendency to low blood sugars during the day week then reduce her metformin to half tablet in the morning She does not need to add Ensure to her breakfast and tends simply use a dietary protein, and reassured her that her protein levels are normal now She can continue increasing exercise and try to lose weight  HYPERLIPIDEMIA: Her LDL is controlled and triglycerides are Improved with her trying to work on her exercise and diet Continue Lipitor alone   Arlin Savona 06/17/2016, 9:05 AM

## 2016-06-17 NOTE — Patient Instructions (Addendum)
Reduce Metformin to 1/2 in am  Ensure: need sugar free/ low carb  Exercise

## 2016-06-20 DIAGNOSIS — D235 Other benign neoplasm of skin of trunk: Secondary | ICD-10-CM | POA: Diagnosis not present

## 2016-06-27 ENCOUNTER — Encounter: Payer: Self-pay | Admitting: *Deleted

## 2016-06-27 DIAGNOSIS — Z01419 Encounter for gynecological examination (general) (routine) without abnormal findings: Secondary | ICD-10-CM | POA: Diagnosis not present

## 2016-07-04 DIAGNOSIS — E538 Deficiency of other specified B group vitamins: Secondary | ICD-10-CM | POA: Diagnosis not present

## 2016-07-04 DIAGNOSIS — R300 Dysuria: Secondary | ICD-10-CM | POA: Diagnosis not present

## 2016-07-04 DIAGNOSIS — K1230 Oral mucositis (ulcerative), unspecified: Secondary | ICD-10-CM | POA: Diagnosis not present

## 2016-07-08 DIAGNOSIS — K6289 Other specified diseases of anus and rectum: Secondary | ICD-10-CM | POA: Diagnosis not present

## 2016-07-08 DIAGNOSIS — K921 Melena: Secondary | ICD-10-CM | POA: Diagnosis not present

## 2016-07-11 DIAGNOSIS — S90811A Abrasion, right foot, initial encounter: Secondary | ICD-10-CM | POA: Diagnosis not present

## 2016-07-11 DIAGNOSIS — S63251A Unspecified dislocation of left index finger, initial encounter: Secondary | ICD-10-CM | POA: Diagnosis not present

## 2016-07-11 DIAGNOSIS — Z23 Encounter for immunization: Secondary | ICD-10-CM | POA: Diagnosis not present

## 2016-07-11 DIAGNOSIS — S63297A Dislocation of distal interphalangeal joint of left little finger, initial encounter: Secondary | ICD-10-CM | POA: Diagnosis not present

## 2016-07-23 ENCOUNTER — Other Ambulatory Visit: Payer: Self-pay

## 2016-07-23 DIAGNOSIS — N3090 Cystitis, unspecified without hematuria: Secondary | ICD-10-CM | POA: Diagnosis not present

## 2016-07-23 DIAGNOSIS — B9689 Other specified bacterial agents as the cause of diseases classified elsewhere: Secondary | ICD-10-CM | POA: Diagnosis not present

## 2016-07-23 DIAGNOSIS — R197 Diarrhea, unspecified: Secondary | ICD-10-CM | POA: Diagnosis not present

## 2016-07-23 DIAGNOSIS — K6289 Other specified diseases of anus and rectum: Secondary | ICD-10-CM | POA: Diagnosis not present

## 2016-07-23 DIAGNOSIS — N760 Acute vaginitis: Secondary | ICD-10-CM | POA: Diagnosis not present

## 2016-07-23 DIAGNOSIS — G8929 Other chronic pain: Secondary | ICD-10-CM | POA: Diagnosis not present

## 2016-07-23 MED ORDER — ATORVASTATIN CALCIUM 10 MG PO TABS: 10.0000 mg | ORAL_TABLET | Freq: Every day | ORAL | 1 refills | Status: DC

## 2016-08-01 DIAGNOSIS — Z23 Encounter for immunization: Secondary | ICD-10-CM | POA: Diagnosis not present

## 2016-08-22 DIAGNOSIS — Z23 Encounter for immunization: Secondary | ICD-10-CM | POA: Diagnosis not present

## 2016-08-27 DIAGNOSIS — F3341 Major depressive disorder, recurrent, in partial remission: Secondary | ICD-10-CM | POA: Diagnosis not present

## 2016-08-29 DIAGNOSIS — S63287A Dislocation of proximal interphalangeal joint of left little finger, initial encounter: Secondary | ICD-10-CM | POA: Diagnosis not present

## 2016-09-05 DIAGNOSIS — G4733 Obstructive sleep apnea (adult) (pediatric): Secondary | ICD-10-CM | POA: Diagnosis not present

## 2016-09-23 DIAGNOSIS — D510 Vitamin B12 deficiency anemia due to intrinsic factor deficiency: Secondary | ICD-10-CM | POA: Diagnosis not present

## 2016-10-07 ENCOUNTER — Other Ambulatory Visit: Payer: Self-pay | Admitting: Endocrinology

## 2016-10-07 DIAGNOSIS — H16293 Other keratoconjunctivitis, bilateral: Secondary | ICD-10-CM | POA: Diagnosis not present

## 2016-10-07 DIAGNOSIS — E1310 Other specified diabetes mellitus with ketoacidosis without coma: Secondary | ICD-10-CM | POA: Diagnosis not present

## 2016-10-08 LAB — COMPREHENSIVE METABOLIC PANEL
ALK PHOS: 220 IU/L — AB (ref 39–117)
ALT: 38 IU/L — ABNORMAL HIGH (ref 0–32)
AST: 36 IU/L (ref 0–40)
Albumin/Globulin Ratio: 1.3 (ref 1.2–2.2)
Albumin: 4 g/dL (ref 3.6–4.8)
BUN/Creatinine Ratio: 6 — ABNORMAL LOW (ref 12–28)
BUN: 9 mg/dL (ref 8–27)
Bilirubin Total: 0.4 mg/dL (ref 0.0–1.2)
CHLORIDE: 106 mmol/L (ref 96–106)
CO2: 20 mmol/L (ref 18–29)
CREATININE: 1.4 mg/dL — AB (ref 0.57–1.00)
Calcium: 9.5 mg/dL (ref 8.7–10.3)
GFR calc Af Amer: 46 mL/min/{1.73_m2} — ABNORMAL LOW (ref >59)
GFR calc non Af Amer: 40 mL/min/{1.73_m2} — ABNORMAL LOW (ref >59)
GLUCOSE: 99 mg/dL (ref 65–99)
Globulin, Total: 3 g/dL (ref 1.5–4.5)
Potassium: 4.4 mmol/L (ref 3.5–5.2)
Sodium: 145 mmol/L — ABNORMAL HIGH (ref 134–144)
Total Protein: 7 g/dL (ref 6.0–8.5)

## 2016-10-08 LAB — MICROALBUMIN / CREATININE URINE RATIO
Creatinine, Urine: 84.9 mg/dL
MICROALB/CREAT RATIO: 3.7 mg/g{creat} (ref 0.0–30.0)
Microalbumin, Urine: 3.1 ug/mL

## 2016-10-08 LAB — HEMOGLOBIN A1C
Est. average glucose Bld gHb Est-mCnc: 105 mg/dL
Hgb A1c MFr Bld: 5.3 % (ref 4.8–5.6)

## 2016-10-08 LAB — LIPID PANEL
CHOLESTEROL TOTAL: 137 mg/dL (ref 100–199)
Chol/HDL Ratio: 4.4 ratio units (ref 0.0–4.4)
HDL: 31 mg/dL — AB (ref >39)
LDL CALC: 51 mg/dL (ref 0–99)
TRIGLYCERIDES: 276 mg/dL — AB (ref 0–149)
VLDL CHOLESTEROL CAL: 55 mg/dL — AB (ref 5–40)

## 2016-10-09 DIAGNOSIS — N3001 Acute cystitis with hematuria: Secondary | ICD-10-CM | POA: Diagnosis not present

## 2016-10-09 DIAGNOSIS — J329 Chronic sinusitis, unspecified: Secondary | ICD-10-CM | POA: Diagnosis not present

## 2016-10-09 DIAGNOSIS — B9689 Other specified bacterial agents as the cause of diseases classified elsewhere: Secondary | ICD-10-CM | POA: Diagnosis not present

## 2016-10-10 DIAGNOSIS — S63287A Dislocation of proximal interphalangeal joint of left little finger, initial encounter: Secondary | ICD-10-CM | POA: Diagnosis not present

## 2016-10-11 DIAGNOSIS — H16293 Other keratoconjunctivitis, bilateral: Secondary | ICD-10-CM | POA: Diagnosis not present

## 2016-10-23 ENCOUNTER — Other Ambulatory Visit: Payer: Self-pay | Admitting: *Deleted

## 2016-10-23 ENCOUNTER — Ambulatory Visit (INDEPENDENT_AMBULATORY_CARE_PROVIDER_SITE_OTHER): Payer: Medicare Other | Admitting: Endocrinology

## 2016-10-23 VITALS — BP 111/76 | HR 98 | Ht 64.17 in | Wt 183.8 lb

## 2016-10-23 DIAGNOSIS — IMO0002 Reserved for concepts with insufficient information to code with codable children: Secondary | ICD-10-CM

## 2016-10-23 DIAGNOSIS — E118 Type 2 diabetes mellitus with unspecified complications: Secondary | ICD-10-CM

## 2016-10-23 DIAGNOSIS — E119 Type 2 diabetes mellitus without complications: Secondary | ICD-10-CM | POA: Diagnosis not present

## 2016-10-23 DIAGNOSIS — E782 Mixed hyperlipidemia: Secondary | ICD-10-CM | POA: Diagnosis not present

## 2016-10-23 DIAGNOSIS — E1165 Type 2 diabetes mellitus with hyperglycemia: Secondary | ICD-10-CM

## 2016-10-23 MED ORDER — ONETOUCH DELICA LANCETS FINE MISC: 3 refills | Status: DC

## 2016-10-23 MED ORDER — FENOFIBRATE 145 MG PO TABS: 145.0000 mg | ORAL_TABLET | Freq: Every day | ORAL | 3 refills | Status: DC

## 2016-10-23 MED ORDER — METFORMIN HCL 1000 MG PO TABS: ORAL_TABLET | ORAL | 3 refills | Status: DC

## 2016-10-23 NOTE — Progress Notes (Signed)
Patient ID: Kristin Olson, female   DOB: 07-10-1956, 61 y.o.   MRN: [id number]   Reason for Appointment: Diabetes follow-up   History of Present Illness   Diagnosis: Type 2 DIABETES MELITUS, date of diagnosis: 1999      She has had mild diabetes for several years which has been usually well controlled with metformin alone.   A1c is again excellent at 5.3 Previously has been as low as 5.2 and up to 5.9 She is on treatment with metformin alone, currently taking 1 g twice a day.   Current blood sugars and self management:  She is able to keep her weight about the same  However has not exercised regularly  Will periodically go off her diet with eating some sweets or drinking regular Pepsi  Her blood sugars are being monitored mostly after meals and they are mostly under 150 and only twice have been over 200  Has been compliant with metformin and has no GI side effects with this  Lab fasting glucose is 99  Home glucose levels: By recall the range is  112-203   Oral hypoglycemic drugs: Metformin 1 g twice a day        Side effects from medications: None Monitors blood glucose:  infrequently     Glucometer: One Touch.    Results as above Hypoglycemia: None            Wt Readings from Last 3 Encounters:  10/23/16 183 lb 12.8 oz (83.4 kg)  06/17/16 185 lb (83.9 kg)  02/07/16 187 lb 9.6 oz (85.1 kg)   Diet: eggs/Bacon and toast in the morning   Physical activity: exercise: Walking on and off, planning to start going to a gym           Lab Results  Component Value Date   HGBA1C 5.3 10/07/2016   HGBA1C 5.3 05/24/2016   HGBA1C 5.9 (H) 01/15/2016   Lab Results  Component Value Date   LDLCALC 51 10/07/2016   CREATININE 1.40 (H) 10/07/2016    Microalbumin normal in 3/17  PRIOR history:  Because of somewhat higher blood sugars and weight gain she was tried on Byetta in 2006 and subsequently Victoza. Previously Victoza had helped her with portion control and  weight loss.    However because she had difficulty with nausea when she was taking this it was stopped, also  could not afford this because of insurance noncoverage; she stopped taking this in 2014  Allergies as of 10/23/2016      Reactions   Levaquin [levofloxacin In D5w] Itching      Medication List       Accurate as of 10/23/16  8:27 AM. Always use your most recent med list.          atorvastatin 10 MG tablet Commonly known as:  LIPITOR Take 1 tablet (10 mg total) by mouth daily.   calcium citrate-vitamin D 315-200 MG-UNIT tablet Commonly known as:  CITRACAL+D Take 1 tablet by mouth 2 (two) times daily.   diazepam 5 MG tablet Commonly known as:  VALIUM Take 5 mg by mouth 3 (three) times daily.   dicyclomine 10 MG capsule Commonly known as:  BENTYL Take 10 mg by mouth 4 (four) times daily -  before meals and at bedtime.   gabapentin 400 MG capsule Commonly known as:  NEURONTIN Take 400 mg by mouth 2 (two) times daily.   glucose blood test strip Commonly known as:  ONE TOUCH ULTRA TEST Use  as instructed to check blood sugar 2 times per day dx code E11.65   hyoscyamine 0.125 MG Tbdp disintergrating tablet Commonly known as:  ANASPAZ Place 0.125 mg under the tongue.   ibuprofen 800 MG tablet Commonly known as:  ADVIL,MOTRIN Take 800 mg by mouth every 8 (eight) hours as needed.   metFORMIN 1000 MG tablet Commonly known as:  GLUCOPHAGE TAKE 1 BY MOUTH TWICE DAILY   mirtazapine 7.5 MG tablet Commonly known as:  REMERON Take 2 mg by mouth at bedtime.   omeprazole 20 MG capsule Commonly known as:  PRILOSEC 20 mg.   ONETOUCH DELICA LANCETS FINE Misc Use twice a day dx code E11.65   oxybutynin 15 MG 24 hr tablet Commonly known as:  DITROPAN XL Take 15 mg by mouth daily.   risperiDONE 2 MG tablet Commonly known as:  RISPERDAL Take 2 mg by mouth once.   sertraline 100 MG tablet Commonly known as:  ZOLOFT Take 100 mg by mouth daily. 2 tablets by mouth daily    SUMAtriptan 100 MG tablet Commonly known as:  IMITREX Take 100 mg by mouth every 4 (four) hours as needed for migraine.   traMADol 50 MG tablet Commonly known as:  ULTRAM Take 50 mg by mouth every 6 (six) hours as needed.   trimethoprim 100 MG tablet Commonly known as:  TRIMPEX Take 100 mg by mouth 2 (two) times daily.   VENTOLIN HFA 108 (90 Base) MCG/ACT inhaler Generic drug:  albuterol       No past medical history on file.  No past surgical history on file.  Family History  Problem Relation Age of Onset  . Diabetes Mother   . Diabetes Maternal Grandfather     Social History:  reports that she has never smoked. She does not have any smokeless tobacco history on file. Her alcohol and drug histories are not on file.  Allergies:  Allergies  Allergen Reactions  . Levaquin [Levofloxacin In D5w] Itching     Review of systems:  Takes Motrin periodically for rectal pain Her creatinine is higher at 1.4, previously has been normal.  She thinks she had a bladder infection also last week when she had her labs  HYPERLIPIDEMIA: She has had high LDL, high triglycerides as also LDL particle number  and low HDL.   Previously her particle number was excellent at 555  She is taking Lipitor 10 mg daily and her levels are excellent Triglycerides are now over 250, previously 159 Currently not on niacin and HDL is still relatively low   Lab Results  Component Value Date   CHOL 137 10/07/2016   CHOL 120 05/24/2016   CHOL 120 01/15/2016   Lab Results  Component Value Date   HDL 31 (L) 10/07/2016   HDL 33 (L) 05/24/2016   HDL 27 (L) 01/15/2016   Lab Results  Component Value Date   LDLCALC 51 10/07/2016   LDLCALC 55 05/24/2016   LDLCALC 32 01/15/2016   Lab Results  Component Value Date   TRIG 276 (H) 10/07/2016   TRIG 159 (H) 05/24/2016   TRIG 306 (H) 01/15/2016   Lab Results  Component Value Date   CHOLHDL 4.4 10/07/2016   CHOLHDL 3.6 05/24/2016   CHOLHDL 4.4  01/15/2016   No results found for: LDLDIRECT  She has had problems with depression which is long-standing, taking Risperdal with control   She  had several surgeries because of biliary duct obstruction She has been treated at [facility] and has  Previously had normal liver functions Alkaline phosphatase has increased again  Lab Results  Component Value Date   ALKPHOS 220 (H) 10/07/2016       Examination:   BP 111/76   Pulse 98   Ht 5' 4.17" (1.63 m)   Wt 183 lb 12.8 oz (83.4 kg)   SpO2 97%   BMI 31.38 kg/m   Body mass index is 31.38 kg/m.    Assesment/PLAN:  Diabetes type 2, Mild and with good control   The patient's diabetes has been relatively mild and treated with metformin only Weight is unchanged A1c is stable at 5.3 She does need to cut back on certain things in her diet such as sweet drinks and high-fat meats like bacon Also needs to start exercising which she has not been doing May have tendency to high post prandial readings based on her diet   HYPERLIPIDEMIA: Her LDL is not controlled and triglycerides are higher, this may improve with improved diet as well as will need to restart her back on fenofibrate 145 mg daily Continue Lipitor as LDL is fairly good   Abnormal liver functions: She will call [facility] for follow-up  Abnormal renal function: May be related to ibuprofen, likely to be related to concomitant cystitis. She will stop ibuprofen, use tramadol more often for pain and follow-up with PCP    [facility] IncKUMAR,Skyylar Kopf 10/23/2016, 8:27 AM

## 2016-10-23 NOTE — Patient Instructions (Addendum)
Avoid Ibuprofen  Avoid bacon etc.   Check with Dr Sherral Hammersobbins

## 2016-10-25 DIAGNOSIS — R159 Full incontinence of feces: Secondary | ICD-10-CM | POA: Diagnosis not present

## 2016-10-25 DIAGNOSIS — R933 Abnormal findings on diagnostic imaging of other parts of digestive tract: Secondary | ICD-10-CM | POA: Diagnosis not present

## 2016-10-25 DIAGNOSIS — K6289 Other specified diseases of anus and rectum: Secondary | ICD-10-CM | POA: Diagnosis not present

## 2016-10-29 DIAGNOSIS — K831 Obstruction of bile duct: Secondary | ICD-10-CM | POA: Diagnosis not present

## 2016-10-30 DIAGNOSIS — F3341 Major depressive disorder, recurrent, in partial remission: Secondary | ICD-10-CM | POA: Diagnosis not present

## 2016-11-04 DIAGNOSIS — D510 Vitamin B12 deficiency anemia due to intrinsic factor deficiency: Secondary | ICD-10-CM | POA: Diagnosis not present

## 2016-11-13 DIAGNOSIS — F331 Major depressive disorder, recurrent, moderate: Secondary | ICD-10-CM | POA: Diagnosis not present

## 2016-11-28 DIAGNOSIS — K6289 Other specified diseases of anus and rectum: Secondary | ICD-10-CM | POA: Diagnosis not present

## 2016-11-28 DIAGNOSIS — R159 Full incontinence of feces: Secondary | ICD-10-CM | POA: Diagnosis not present

## 2016-12-06 DIAGNOSIS — N3001 Acute cystitis with hematuria: Secondary | ICD-10-CM | POA: Diagnosis not present

## 2016-12-06 DIAGNOSIS — N3090 Cystitis, unspecified without hematuria: Secondary | ICD-10-CM | POA: Diagnosis not present

## 2016-12-07 DIAGNOSIS — G4733 Obstructive sleep apnea (adult) (pediatric): Secondary | ICD-10-CM | POA: Diagnosis not present

## 2016-12-10 ENCOUNTER — Other Ambulatory Visit: Payer: Self-pay

## 2016-12-10 MED ORDER — METFORMIN HCL 1000 MG PO TABS: ORAL_TABLET | ORAL | 3 refills | Status: DC

## 2016-12-16 DIAGNOSIS — R928 Other abnormal and inconclusive findings on diagnostic imaging of breast: Secondary | ICD-10-CM | POA: Diagnosis not present

## 2016-12-19 DIAGNOSIS — N3001 Acute cystitis with hematuria: Secondary | ICD-10-CM | POA: Diagnosis not present

## 2016-12-19 DIAGNOSIS — Z1389 Encounter for screening for other disorder: Secondary | ICD-10-CM | POA: Diagnosis not present

## 2016-12-19 DIAGNOSIS — D510 Vitamin B12 deficiency anemia due to intrinsic factor deficiency: Secondary | ICD-10-CM | POA: Diagnosis not present

## 2016-12-19 DIAGNOSIS — Z0000 Encounter for general adult medical examination without abnormal findings: Secondary | ICD-10-CM | POA: Diagnosis not present

## 2016-12-20 DIAGNOSIS — K602 Anal fissure, unspecified: Secondary | ICD-10-CM | POA: Diagnosis not present

## 2016-12-20 DIAGNOSIS — K6289 Other specified diseases of anus and rectum: Secondary | ICD-10-CM | POA: Diagnosis not present

## 2016-12-20 DIAGNOSIS — Z7984 Long term (current) use of oral hypoglycemic drugs: Secondary | ICD-10-CM | POA: Diagnosis not present

## 2016-12-20 DIAGNOSIS — E119 Type 2 diabetes mellitus without complications: Secondary | ICD-10-CM | POA: Diagnosis not present

## 2016-12-20 DIAGNOSIS — K644 Residual hemorrhoidal skin tags: Secondary | ICD-10-CM | POA: Diagnosis not present

## 2016-12-20 DIAGNOSIS — K921 Melena: Secondary | ICD-10-CM | POA: Diagnosis not present

## 2016-12-20 DIAGNOSIS — K626 Ulcer of anus and rectum: Secondary | ICD-10-CM | POA: Diagnosis not present

## 2017-01-03 DIAGNOSIS — R748 Abnormal levels of other serum enzymes: Secondary | ICD-10-CM | POA: Diagnosis not present

## 2017-01-03 DIAGNOSIS — K6289 Other specified diseases of anus and rectum: Secondary | ICD-10-CM | POA: Diagnosis not present

## 2017-01-03 DIAGNOSIS — K626 Ulcer of anus and rectum: Secondary | ICD-10-CM | POA: Diagnosis not present

## 2017-01-03 DIAGNOSIS — K602 Anal fissure, unspecified: Secondary | ICD-10-CM | POA: Diagnosis not present

## 2017-01-16 DIAGNOSIS — E1165 Type 2 diabetes mellitus with hyperglycemia: Secondary | ICD-10-CM | POA: Diagnosis not present

## 2017-01-16 DIAGNOSIS — F331 Major depressive disorder, recurrent, moderate: Secondary | ICD-10-CM | POA: Diagnosis not present

## 2017-01-28 ENCOUNTER — Other Ambulatory Visit: Payer: Self-pay

## 2017-01-28 DIAGNOSIS — N3001 Acute cystitis with hematuria: Secondary | ICD-10-CM | POA: Diagnosis not present

## 2017-01-28 DIAGNOSIS — Z6833 Body mass index (BMI) 33.0-33.9, adult: Secondary | ICD-10-CM | POA: Diagnosis not present

## 2017-01-28 MED ORDER — ATORVASTATIN CALCIUM 10 MG PO TABS: 10.0000 mg | ORAL_TABLET | Freq: Every day | ORAL | 1 refills | Status: DC

## 2017-02-06 DIAGNOSIS — K602 Anal fissure, unspecified: Secondary | ICD-10-CM | POA: Diagnosis not present

## 2017-02-06 DIAGNOSIS — M62838 Other muscle spasm: Secondary | ICD-10-CM | POA: Diagnosis not present

## 2017-02-20 ENCOUNTER — Ambulatory Visit: Payer: Medicare Other | Admitting: Endocrinology

## 2017-03-03 DIAGNOSIS — E119 Type 2 diabetes mellitus without complications: Secondary | ICD-10-CM | POA: Diagnosis not present

## 2017-03-03 DIAGNOSIS — Z87891 Personal history of nicotine dependence: Secondary | ICD-10-CM | POA: Diagnosis not present

## 2017-03-03 DIAGNOSIS — R159 Full incontinence of feces: Secondary | ICD-10-CM | POA: Diagnosis not present

## 2017-03-03 DIAGNOSIS — Q438 Other specified congenital malformations of intestine: Secondary | ICD-10-CM | POA: Diagnosis not present

## 2017-03-03 DIAGNOSIS — Z6830 Body mass index (BMI) 30.0-30.9, adult: Secondary | ICD-10-CM | POA: Diagnosis not present

## 2017-03-03 DIAGNOSIS — K6289 Other specified diseases of anus and rectum: Secondary | ICD-10-CM | POA: Diagnosis not present

## 2017-03-03 DIAGNOSIS — E669 Obesity, unspecified: Secondary | ICD-10-CM | POA: Diagnosis not present

## 2017-03-05 DIAGNOSIS — E1165 Type 2 diabetes mellitus with hyperglycemia: Secondary | ICD-10-CM | POA: Diagnosis not present

## 2017-03-12 DIAGNOSIS — E042 Nontoxic multinodular goiter: Secondary | ICD-10-CM | POA: Diagnosis not present

## 2017-03-12 DIAGNOSIS — E1165 Type 2 diabetes mellitus with hyperglycemia: Secondary | ICD-10-CM | POA: Diagnosis not present

## 2017-03-12 DIAGNOSIS — E118 Type 2 diabetes mellitus with unspecified complications: Secondary | ICD-10-CM | POA: Diagnosis not present

## 2017-03-13 DIAGNOSIS — J40 Bronchitis, not specified as acute or chronic: Secondary | ICD-10-CM | POA: Diagnosis not present

## 2017-03-13 DIAGNOSIS — J029 Acute pharyngitis, unspecified: Secondary | ICD-10-CM | POA: Diagnosis not present

## 2017-03-13 DIAGNOSIS — K625 Hemorrhage of anus and rectum: Secondary | ICD-10-CM | POA: Diagnosis not present

## 2017-03-13 DIAGNOSIS — J329 Chronic sinusitis, unspecified: Secondary | ICD-10-CM | POA: Diagnosis not present

## 2017-03-13 LAB — HEMOGLOBIN A1C
Est. average glucose Bld gHb Est-mCnc: 94 mg/dL
Hgb A1c MFr Bld: 4.9 % (ref 4.8–5.6)

## 2017-03-13 LAB — COMPREHENSIVE METABOLIC PANEL
A/G RATIO: 1.4 (ref 1.2–2.2)
ALK PHOS: 64 IU/L (ref 39–117)
ALT: 19 IU/L (ref 0–32)
AST: 34 IU/L (ref 0–40)
Albumin: 4.1 g/dL (ref 3.6–4.8)
BILIRUBIN TOTAL: 0.3 mg/dL (ref 0.0–1.2)
BUN / CREAT RATIO: 6 — AB (ref 12–28)
BUN: 8 mg/dL (ref 8–27)
CHLORIDE: 110 mmol/L — AB (ref 96–106)
CO2: 20 mmol/L (ref 18–29)
Calcium: 9.7 mg/dL (ref 8.7–10.3)
Creatinine, Ser: 1.34 mg/dL — ABNORMAL HIGH (ref 0.57–1.00)
GFR calc Af Amer: 49 mL/min/{1.73_m2} — ABNORMAL LOW (ref >59)
GFR calc non Af Amer: 42 mL/min/{1.73_m2} — ABNORMAL LOW (ref >59)
GLUCOSE: 125 mg/dL — AB (ref 65–99)
Globulin, Total: 2.9 g/dL (ref 1.5–4.5)
Potassium: 4.2 mmol/L (ref 3.5–5.2)
Sodium: 147 mmol/L — ABNORMAL HIGH (ref 134–144)
Total Protein: 7 g/dL (ref 6.0–8.5)

## 2017-03-13 LAB — LIPID PANEL
CHOL/HDL RATIO: 6.3 ratio — AB (ref 0.0–4.4)
Cholesterol, Total: 139 mg/dL (ref 100–199)
HDL: 22 mg/dL — AB (ref >39)
LDL CALC: 62 mg/dL (ref 0–99)
TRIGLYCERIDES: 276 mg/dL — AB (ref 0–149)
VLDL CHOLESTEROL CAL: 55 mg/dL — AB (ref 5–40)

## 2017-03-21 DIAGNOSIS — J342 Deviated nasal septum: Secondary | ICD-10-CM | POA: Diagnosis not present

## 2017-03-21 DIAGNOSIS — H9319 Tinnitus, unspecified ear: Secondary | ICD-10-CM | POA: Diagnosis not present

## 2017-03-21 DIAGNOSIS — E042 Nontoxic multinodular goiter: Secondary | ICD-10-CM | POA: Diagnosis not present

## 2017-03-21 DIAGNOSIS — J3489 Other specified disorders of nose and nasal sinuses: Secondary | ICD-10-CM | POA: Diagnosis not present

## 2017-03-28 DIAGNOSIS — K529 Noninfective gastroenteritis and colitis, unspecified: Secondary | ICD-10-CM | POA: Diagnosis not present

## 2017-03-28 DIAGNOSIS — K626 Ulcer of anus and rectum: Secondary | ICD-10-CM | POA: Diagnosis not present

## 2017-03-30 NOTE — Progress Notes (Signed)
Patient ID: Kristin Olson, female   DOB: May 11, 1956, 61 y.o.   MRN: [id number]   Reason for Appointment: Diabetes follow-up   History of Present Illness   Diagnosis: Type 2 DIABETES MELITUS, date of diagnosis: 1999      She has had mild diabetes for several years which has been usually well controlled with metformin alone, currently taking 1 g twice a day. .  A1c is 4.9 which is lower than expected for her blood sugars Previous range 5.2-5.9  Current blood sugars, problems identified and self management:  She is able to lose weight gradually  She is trying to make significant changes in controlling her intake of regular Pepsi, previously drinking as much as 6 bottles a day continuously and now about 2 bottles a day  She has been walking on and off depending on other medical problems that she has had  Has been compliant with metformin and has no GI side effects with this  Lab fasting glucose is relatively high  Monitors blood glucose:  infrequently     Glucometer: One Touch.     Home glucose levels: By monitor download show range of 103-177 Checking blood sugars sporadically in the morning hours, some early afternoon but no readings in the evenings Fasting glucose range 117-135  Overall median 133   Oral hypoglycemic drugs: Metformin 1 g twice a day        Side effects from medications: None            Wt Readings from Last 3 Encounters:  03/31/17 180 lb 3.2 oz (81.7 kg)  10/23/16 183 lb 12.8 oz (83.4 kg)  06/17/16 185 lb (83.9 kg)   Diet: May have eggs/Bacon and toast in the morning   Physical activity: exercise: Walking  on and off          Lab Results  Component Value Date   HGBA1C 4.9 03/12/2017   HGBA1C 5.3 10/07/2016   HGBA1C 5.3 05/24/2016   Lab Results  Component Value Date   LDLCALC 62 03/12/2017   CREATININE 1.34 (H) 03/12/2017     Microalbumin normal in 12/17  PRIOR history:  Because of somewhat higher blood sugars and weight gain  she was tried on Byetta in 2006 and subsequently Victoza. Previously Victoza had helped her with portion control and weight loss.    However because she had difficulty with nausea when she was taking this it was stopped, also  could not afford this because of insurance noncoverage; she stopped taking this in 2014  Allergies as of 03/31/2017      Reactions   Levaquin [levofloxacin In D5w] Itching      Medication List       Accurate as of 03/31/17  9:36 AM. Always use your most recent med list.          atorvastatin 10 MG tablet Commonly known as:  LIPITOR Take 1 tablet (10 mg total) by mouth daily.   diazepam 5 MG tablet Commonly known as:  VALIUM Take 5 mg by mouth 3 (three) times daily.   fenofibrate 145 MG tablet Commonly known as:  TRICOR Take 1 tablet (145 mg total) by mouth daily.   gabapentin 400 MG capsule Commonly known as:  NEURONTIN Take 400 mg by mouth 2 (two) times daily.   glucose blood test strip Commonly known as:  ONE TOUCH ULTRA TEST Use as instructed to check blood sugar 2 times per day dx code E11.65   ibuprofen 800 MG  tablet Commonly known as:  ADVIL,MOTRIN Take 800 mg by mouth every 8 (eight) hours as needed.   metFORMIN 1000 MG tablet Commonly known as:  GLUCOPHAGE TAKE 1 BY MOUTH TWICE DAILY   mirtazapine 7.5 MG tablet Commonly known as:  REMERON Take 2 mg by mouth at bedtime.   omeprazole 20 MG capsule Commonly known as:  PRILOSEC 20 mg.   ONETOUCH DELICA LANCETS FINE Misc Use twice a day dx code E11.65   oxybutynin 15 MG 24 hr tablet Commonly known as:  DITROPAN XL Take 15 mg by mouth daily.   risperiDONE 2 MG tablet Commonly known as:  RISPERDAL Take 2 mg by mouth once.   sertraline 100 MG tablet Commonly known as:  ZOLOFT Take 100 mg by mouth daily. 2 tablets by mouth daily   SUMAtriptan 100 MG tablet Commonly known as:  IMITREX Take 100 mg by mouth every 4 (four) hours as needed for migraine.   trimethoprim 100 MG  tablet Commonly known as:  TRIMPEX Take 100 mg by mouth 2 (two) times daily.       No past medical history on file.  No past surgical history on file.  Family History  Problem Relation Age of Onset  . Diabetes Mother   . Diabetes Maternal Grandfather     Social History:  reports that she has never smoked. She has never used smokeless tobacco. Her alcohol and drug histories are not on file.  Allergies:  Allergies  Allergen Reactions  . Levaquin [Levofloxacin In D5w] Itching     Review of systems:   Her creatinine is Still above normal, previously 1.4 and now somewhat better She was told to cut back on ibuprofen which she has   HYPERLIPIDEMIA: She has had high LDL, high triglycerides as also LDL particle number  and low HDL.   Previously her particle number was excellent at 555  She is taking Lipitor 10 mg daily and her LDL levels are excellent Triglycerides not improved despite taking TriCor Currently not on niacin and HDL is much lower also   Lab Results  Component Value Date   CHOL 139 03/12/2017   CHOL 137 10/07/2016   CHOL 120 05/24/2016   Lab Results  Component Value Date   HDL 22 (L) 03/12/2017   HDL 31 (L) 10/07/2016   HDL 33 (L) 05/24/2016   Lab Results  Component Value Date   LDLCALC 62 03/12/2017   LDLCALC 51 10/07/2016   LDLCALC 55 05/24/2016   Lab Results  Component Value Date   TRIG 276 (H) 03/12/2017   TRIG 276 (H) 10/07/2016   TRIG 159 (H) 05/24/2016   Lab Results  Component Value Date   CHOLHDL 6.3 (H) 03/12/2017   CHOLHDL 4.4 10/07/2016   CHOLHDL 3.6 05/24/2016   No results found for: LDLDIRECT  She has had problems with depression which is long-standing, taking Risperdal with control   She  had several 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    Assesment/PLAN:  Diabetes type 2, Mild and with good control   The patient's diabetes has been relatively mild and treated with metformin only Weight is  Gradually improving As discussed above her blood sugars are mildly increased especially fasting and A1c is falsely low at 4.9 She is trying to be more motivated and is cutting back on her intake of large quantities of regular soft drinks Also trying to walk a little more now Since overall sugars are fairly good she can stay on metformin alone  HYPERLIPIDEMIA: Her LDL is better but her triglycerides are increased with her regimen of Lipitor and TriCor She will start taking Lovaza 2 g twice a day Continue Lipitor as LDL is fairly good  Abnormal renal function: Slightly better, but defer management to PCP      [facility],Nasiir Monts 03/31/2017, 9:36 AM

## 2017-03-31 ENCOUNTER — Ambulatory Visit (INDEPENDENT_AMBULATORY_CARE_PROVIDER_SITE_OTHER): Payer: Medicare Other | Admitting: Endocrinology

## 2017-03-31 ENCOUNTER — Encounter: Payer: Self-pay | Admitting: Endocrinology

## 2017-03-31 ENCOUNTER — Ambulatory Visit: Payer: Medicare Other | Admitting: Endocrinology

## 2017-03-31 VITALS — BP 122/88 | HR 84 | Ht 63.5 in | Wt 180.2 lb

## 2017-03-31 DIAGNOSIS — E119 Type 2 diabetes mellitus without complications: Secondary | ICD-10-CM

## 2017-03-31 DIAGNOSIS — E1110 Type 2 diabetes mellitus with ketoacidosis without coma: Secondary | ICD-10-CM | POA: Diagnosis not present

## 2017-03-31 DIAGNOSIS — E782 Mixed hyperlipidemia: Secondary | ICD-10-CM

## 2017-03-31 MED ORDER — OMEGA-3-ACID ETHYL ESTERS 1 G PO CAPS: 2.0000 g | ORAL_CAPSULE | Freq: Two times a day (BID) | ORAL | 3 refills | Status: DC

## 2017-04-01 DIAGNOSIS — N3000 Acute cystitis without hematuria: Secondary | ICD-10-CM | POA: Diagnosis not present

## 2017-04-01 DIAGNOSIS — G44209 Tension-type headache, unspecified, not intractable: Secondary | ICD-10-CM | POA: Diagnosis not present

## 2017-04-01 LAB — MICROALBUMIN / CREATININE URINE RATIO
CREATININE, UR: 106.2 mg/dL
MICROALB/CREAT RATIO: 2.8 mg/g{creat} (ref 0.0–30.0)
MICROALBUM., U, RANDOM: 3 ug/mL

## 2017-04-01 LAB — PLEASE NOTE

## 2017-04-03 DIAGNOSIS — K626 Ulcer of anus and rectum: Secondary | ICD-10-CM | POA: Diagnosis not present

## 2017-04-08 ENCOUNTER — Ambulatory Visit: Payer: Medicare Other | Admitting: Endocrinology

## 2017-04-08 DIAGNOSIS — M542 Cervicalgia: Secondary | ICD-10-CM | POA: Diagnosis not present

## 2017-04-08 DIAGNOSIS — R300 Dysuria: Secondary | ICD-10-CM | POA: Diagnosis not present

## 2017-04-22 DIAGNOSIS — N3020 Other chronic cystitis without hematuria: Secondary | ICD-10-CM | POA: Diagnosis not present

## 2017-04-22 DIAGNOSIS — F331 Major depressive disorder, recurrent, moderate: Secondary | ICD-10-CM | POA: Diagnosis not present

## 2017-04-22 DIAGNOSIS — N3281 Overactive bladder: Secondary | ICD-10-CM | POA: Diagnosis not present

## 2017-05-13 DIAGNOSIS — M5489 Other dorsalgia: Secondary | ICD-10-CM | POA: Diagnosis not present

## 2017-05-16 DIAGNOSIS — K602 Anal fissure, unspecified: Secondary | ICD-10-CM | POA: Diagnosis not present

## 2017-05-16 DIAGNOSIS — N816 Rectocele: Secondary | ICD-10-CM | POA: Diagnosis not present

## 2017-05-16 DIAGNOSIS — K529 Noninfective gastroenteritis and colitis, unspecified: Secondary | ICD-10-CM | POA: Diagnosis not present

## 2017-05-16 DIAGNOSIS — K5289 Other specified noninfective gastroenteritis and colitis: Secondary | ICD-10-CM | POA: Diagnosis not present

## 2017-05-16 DIAGNOSIS — Z87891 Personal history of nicotine dependence: Secondary | ICD-10-CM | POA: Diagnosis not present

## 2017-05-16 DIAGNOSIS — G8929 Other chronic pain: Secondary | ICD-10-CM | POA: Diagnosis not present

## 2017-05-16 DIAGNOSIS — K625 Hemorrhage of anus and rectum: Secondary | ICD-10-CM | POA: Diagnosis not present

## 2017-05-16 DIAGNOSIS — R1031 Right lower quadrant pain: Secondary | ICD-10-CM | POA: Diagnosis not present

## 2017-05-16 DIAGNOSIS — Z79899 Other long term (current) drug therapy: Secondary | ICD-10-CM | POA: Diagnosis not present

## 2017-05-16 DIAGNOSIS — R152 Fecal urgency: Secondary | ICD-10-CM | POA: Diagnosis not present

## 2017-05-16 DIAGNOSIS — R159 Full incontinence of feces: Secondary | ICD-10-CM | POA: Diagnosis not present

## 2017-05-16 DIAGNOSIS — K6289 Other specified diseases of anus and rectum: Secondary | ICD-10-CM | POA: Diagnosis not present

## 2017-05-19 DIAGNOSIS — K529 Noninfective gastroenteritis and colitis, unspecified: Secondary | ICD-10-CM | POA: Diagnosis not present

## 2017-06-05 DIAGNOSIS — R928 Other abnormal and inconclusive findings on diagnostic imaging of breast: Secondary | ICD-10-CM | POA: Diagnosis not present

## 2017-06-05 DIAGNOSIS — R921 Mammographic calcification found on diagnostic imaging of breast: Secondary | ICD-10-CM | POA: Diagnosis not present

## 2017-06-24 DIAGNOSIS — F331 Major depressive disorder, recurrent, moderate: Secondary | ICD-10-CM | POA: Diagnosis not present

## 2017-06-26 ENCOUNTER — Other Ambulatory Visit: Payer: Self-pay

## 2017-06-26 MED ORDER — ATORVASTATIN CALCIUM 10 MG PO TABS: 10.0000 mg | ORAL_TABLET | Freq: Every day | ORAL | 1 refills | Status: DC

## 2017-06-27 DIAGNOSIS — R197 Diarrhea, unspecified: Secondary | ICD-10-CM | POA: Diagnosis not present

## 2017-06-27 DIAGNOSIS — K5730 Diverticulosis of large intestine without perforation or abscess without bleeding: Secondary | ICD-10-CM | POA: Diagnosis not present

## 2017-06-27 DIAGNOSIS — E669 Obesity, unspecified: Secondary | ICD-10-CM | POA: Diagnosis not present

## 2017-06-27 DIAGNOSIS — Z6832 Body mass index (BMI) 32.0-32.9, adult: Secondary | ICD-10-CM | POA: Diagnosis not present

## 2017-06-27 DIAGNOSIS — K6289 Other specified diseases of anus and rectum: Secondary | ICD-10-CM | POA: Diagnosis not present

## 2017-06-27 DIAGNOSIS — E785 Hyperlipidemia, unspecified: Secondary | ICD-10-CM | POA: Diagnosis not present

## 2017-06-27 DIAGNOSIS — E119 Type 2 diabetes mellitus without complications: Secondary | ICD-10-CM | POA: Diagnosis not present

## 2017-06-27 DIAGNOSIS — K625 Hemorrhage of anus and rectum: Secondary | ICD-10-CM | POA: Diagnosis not present

## 2017-06-27 DIAGNOSIS — M1990 Unspecified osteoarthritis, unspecified site: Secondary | ICD-10-CM | POA: Diagnosis not present

## 2017-06-27 DIAGNOSIS — K219 Gastro-esophageal reflux disease without esophagitis: Secondary | ICD-10-CM | POA: Diagnosis not present

## 2017-06-27 DIAGNOSIS — K529 Noninfective gastroenteritis and colitis, unspecified: Secondary | ICD-10-CM | POA: Diagnosis not present

## 2017-06-27 DIAGNOSIS — G4730 Sleep apnea, unspecified: Secondary | ICD-10-CM | POA: Diagnosis not present

## 2017-06-27 DIAGNOSIS — K621 Rectal polyp: Secondary | ICD-10-CM | POA: Diagnosis not present

## 2017-07-15 DIAGNOSIS — H25813 Combined forms of age-related cataract, bilateral: Secondary | ICD-10-CM | POA: Diagnosis not present

## 2017-07-15 DIAGNOSIS — E113291 Type 2 diabetes mellitus with mild nonproliferative diabetic retinopathy without macular edema, right eye: Secondary | ICD-10-CM | POA: Diagnosis not present

## 2017-07-15 LAB — HM DIABETES EYE EXAM

## 2017-07-18 DIAGNOSIS — K529 Noninfective gastroenteritis and colitis, unspecified: Secondary | ICD-10-CM | POA: Diagnosis not present

## 2017-07-22 DIAGNOSIS — F331 Major depressive disorder, recurrent, moderate: Secondary | ICD-10-CM | POA: Diagnosis not present

## 2017-07-28 DIAGNOSIS — E1110 Type 2 diabetes mellitus with ketoacidosis without coma: Secondary | ICD-10-CM | POA: Diagnosis not present

## 2017-07-28 DIAGNOSIS — K130 Diseases of lips: Secondary | ICD-10-CM | POA: Diagnosis not present

## 2017-07-28 DIAGNOSIS — L853 Xerosis cutis: Secondary | ICD-10-CM | POA: Diagnosis not present

## 2017-07-28 DIAGNOSIS — B372 Candidiasis of skin and nail: Secondary | ICD-10-CM | POA: Diagnosis not present

## 2017-07-29 LAB — HEMOGLOBIN A1C
Est. average glucose Bld gHb Est-mCnc: 105 mg/dL
HEMOGLOBIN A1C: 5.3 % (ref 4.8–5.6)

## 2017-07-29 LAB — COMPREHENSIVE METABOLIC PANEL
ALT: 34 IU/L — AB (ref 0–32)
AST: 41 IU/L — ABNORMAL HIGH (ref 0–40)
Albumin/Globulin Ratio: 1.4 (ref 1.2–2.2)
Albumin: 4.6 g/dL (ref 3.6–4.8)
Alkaline Phosphatase: 53 IU/L (ref 39–117)
BILIRUBIN TOTAL: 0.3 mg/dL (ref 0.0–1.2)
BUN/Creatinine Ratio: 8 — ABNORMAL LOW (ref 12–28)
BUN: 15 mg/dL (ref 8–27)
CALCIUM: 9.1 mg/dL (ref 8.7–10.3)
CHLORIDE: 109 mmol/L — AB (ref 96–106)
CO2: 18 mmol/L — ABNORMAL LOW (ref 20–29)
Creatinine, Ser: 1.79 mg/dL — ABNORMAL HIGH (ref 0.57–1.00)
GFR calc Af Amer: 34 mL/min/{1.73_m2} — ABNORMAL LOW (ref >59)
GFR calc non Af Amer: 30 mL/min/{1.73_m2} — ABNORMAL LOW (ref >59)
GLUCOSE: 116 mg/dL — AB (ref 65–99)
Globulin, Total: 3.2 g/dL (ref 1.5–4.5)
Potassium: 4.8 mmol/L (ref 3.5–5.2)
Sodium: 145 mmol/L — ABNORMAL HIGH (ref 134–144)
Total Protein: 7.8 g/dL (ref 6.0–8.5)

## 2017-07-29 LAB — LIPID PANEL
CHOL/HDL RATIO: 5 ratio — AB (ref 0.0–4.4)
Cholesterol, Total: 125 mg/dL (ref 100–199)
HDL: 25 mg/dL — ABNORMAL LOW (ref >39)
LDL Calculated: 46 mg/dL (ref 0–99)
TRIGLYCERIDES: 270 mg/dL — AB (ref 0–149)
VLDL CHOLESTEROL CAL: 54 mg/dL — AB (ref 5–40)

## 2017-07-31 ENCOUNTER — Ambulatory Visit: Payer: Medicare Other | Admitting: Endocrinology

## 2017-08-03 NOTE — Progress Notes (Signed)
Patient ID: Kristin Olson, female   DOB: [birth year], 61 y.o.   MRN: [id number]   Reason for Appointment: Endocrinology follow-up   History of Present Illness   Diagnosis: Type 2 DIABETES MELITUS, date of diagnosis: 1999      She has had mild diabetes for several years which has been usually well controlled with metformin alone, currently taking 1 g twice a day. .  A1c is   again lower than expected for her blood sugars, now 5.3 Previous range 4.9-5.9  Current blood sugars, problems identified and self management:  She is checking blood sugars sporadically but did not bring her meter  She is asking about continuing his glucose monitor but she does not need to have intensive monitoring of her sugars  She thinks she is trying to avoid Pepsi usually but periodically will have this although highest blood sugar after drinking this was only 163  However has not lost any further weight  Has been compliant with metformin and has no GI side effects with this  Lab fasting glucose is relatively good at 116  Monitors blood glucose:  infrequently     Glucometer: One Touch.     Home glucose levels: By recall: am 129 and postprandial 137 Does not remember other readings     Oral hypoglycemic drugs: Metformin 1 g twice a day        Side effects from medications: None            Wt Readings from Last 3 Encounters:  08/04/17 182 lb 12.8 oz (82.9 kg)  03/31/17 180 lb 3.2 oz (81.7 kg)  10/23/16 183 lb 12.8 oz (83.4 kg)   Diet: May have eggs/Bacon and toast in the morning   Physical activity: exercise: Walking           Lab Results  Component Value Date   HGBA1C 5.3 07/28/2017   HGBA1C 4.9 03/12/2017   HGBA1C 5.3 10/07/2016   Lab Results  Component Value Date   LDLCALC 46 07/28/2017   CREATININE 1.79 (H) 07/28/2017     Microalbumin normal in 12/17  PRIOR history:  Because of somewhat higher blood sugars and weight gain she was tried on Byetta in 2006 and  subsequently Victoza. Previously Victoza had helped her with portion control and weight loss.    However because she had difficulty with nausea when she was taking this it was stopped, also  could not afford this because of insurance noncoverage; she stopped taking this in 2014    OTHER active problems: See review of systems Abnormal liver   Allergies as of 08/04/2017      Reactions   Levaquin [levofloxacin In D5w] Itching      Medication List       Accurate as of 08/04/17  8:27 AM. Always use your most recent med list.          atorvastatin 10 MG tablet Commonly known as:  LIPITOR Take 1 tablet (10 mg total) by mouth daily.   diazepam 5 MG tablet Commonly known as:  VALIUM Take 5 mg by mouth 3 (three) times daily.   fenofibrate 145 MG tablet Commonly known as:  TRICOR Take 1 tablet (145 mg total) by mouth daily.   gabapentin 400 MG capsule Commonly known as:  NEURONTIN Take 400 mg by mouth 2 (two) times daily.   glucose blood test strip Commonly known as:  ONE TOUCH ULTRA TEST Use as instructed to check blood sugar 2 times per day  dx code E11.65   ibuprofen 800 MG tablet Commonly known as:  ADVIL,MOTRIN Take 800 mg by mouth every 8 (eight) hours as needed.   metFORMIN 1000 MG tablet Commonly known as:  GLUCOPHAGE TAKE 1 BY MOUTH TWICE DAILY   mirtazapine 7.5 MG tablet Commonly known as:  REMERON Take 2 mg by mouth at bedtime.   omega-3 acid ethyl esters 1 g capsule Commonly known as:  LOVAZA Take 2 capsules (2 g total) by mouth 2 (two) times daily.   omeprazole 20 MG capsule Commonly known as:  PRILOSEC 20 mg.   ONETOUCH DELICA LANCETS FINE Misc Use twice a day dx code E11.65   oxybutynin 15 MG 24 hr tablet Commonly known as:  DITROPAN XL Take 15 mg by mouth daily.   risperiDONE 2 MG tablet Commonly known as:  RISPERDAL Take 2 mg by mouth once.   sertraline 100 MG tablet Commonly known as:  ZOLOFT Take 100 mg by mouth daily. 2 tablets by  mouth daily   SUMAtriptan 100 MG tablet Commonly known as:  IMITREX Take 100 mg by mouth every 4 (four) hours as needed for migraine.   trimethoprim 100 MG tablet Commonly known as:  TRIMPEX Take 100 mg by mouth 2 (two) times daily.       No past medical history on file.  No past surgical history on file.  Family History  Problem Relation Age of Onset  . Diabetes Mother   . Diabetes Maternal Grandfather     Social History:  reports that she has never smoked. She has never used smokeless tobacco. Her alcohol and drug histories are not on file.  Allergies:  Allergies  Allergen Reactions  . Levaquin [Levofloxacin In D5w] Itching     Review of systems:   Her creatinine is Now much above normal, previously 1.4 and now 1.8 She was told to cut back on ibuprofen which she has taken fairly regularly, does not understand that Advil is the same  HYPERLIPIDEMIA: She has had high LDL, high triglycerides as also LDL particle number  and low HDL.   Previously her particle number was excellent at 555  She is taking Lipitor 10 mg daily and her LDL levels are excellent Triglycerides not improved despite taking Lovaza in addition to her TriCor  Currently not on niacin and HDL is much lower again, had no difficulty tolerating this previously without aspirin   Lab Results  Component Value Date   CHOL 125 07/28/2017   CHOL 139 03/12/2017   CHOL 137 10/07/2016   Lab Results  Component Value Date   HDL 25 (L) 07/28/2017   HDL 22 (L) 03/12/2017   HDL 31 (L) 10/07/2016   Lab Results  Component Value Date   LDLCALC 46 07/28/2017   LDLCALC 62 03/12/2017   LDLCALC 51 10/07/2016   Lab Results  Component Value Date   TRIG 270 (H) 07/28/2017   TRIG 276 (H) 03/12/2017   TRIG 276 (H) 10/07/2016   Lab Results  Component Value Date   CHOLHDL 5.0 (H) 07/28/2017   CHOLHDL 6.3 (H) 03/12/2017   CHOLHDL 4.4 10/07/2016   No results found for: LDLDIRECT  She has had problems with  depression which is long-standing, taking Risperdal with control   She  had several surgeries because of biliary duct obstruction She has been treated at [facility] Alkaline phosphatase has come back to normal, Has minimal increase in ALT  Lab Results  Component Value Date   [facility] 07/28/2017  Examination:   BP 112/72   Pulse 95   Ht 5' 3.5" (1.613 m)   Wt 182 lb 12.8 oz (82.9 kg)   SpO2 98%   BMI 31.87 kg/m   Body mass index is 31.87 kg/m.    Assesment/PLAN:  Diabetes type 2, Mild and with Consistently good control   She still metformin alone and her blood sugars are usually close to normal A1c appears to be lower than expected at 5.3 Her main difficulty is consistently staying on diet and especially avoiding regular soft drinks which she is doing better with Also trying to exercise on a treadmill and encouraged her to continue this  Discussed with patient the need for checking blood sugars some after meals in addition to fasting She can check about 3 times a week since her blood sugars do not fluctuate significantly Also because for renal dysfunction she will temporarily cut her metformin in half  HYPERLIPIDEMIA: Her LDL is controlled, triglycerides still not improved with Lovaza She probably did benefit from niacin before and discussed that we can try this at a low dose of 750 mg daily She does not have any medical liver disease and can take this safely as she did before However because for renal dysfunction she will stop her TriCor for now Continue Lovaza, discussed triglycerides targets Also needs to continue first to lose weight Follow-up in 3 months again   Abnormal renal function: Slightly worse and likely to be related to continued use of ibuprofen She'll stop ibuprofen and discuss alternative pain medication with her PCP and gastroenterologist She will follow-up with her PCP in 3 -4 weeks, consider nephrology consultation if not improved for further  evaluation    Counseling time on subjects discussed in assessment and plan sections is over 50% of today's 25 minute visit    Kristin Olson 08/04/2017, 8:27 AM

## 2017-08-04 ENCOUNTER — Ambulatory Visit (INDEPENDENT_AMBULATORY_CARE_PROVIDER_SITE_OTHER): Payer: Medicare Other | Admitting: Endocrinology

## 2017-08-04 ENCOUNTER — Encounter: Payer: Self-pay | Admitting: Endocrinology

## 2017-08-04 VITALS — BP 112/72 | HR 95 | Ht 63.5 in | Wt 182.8 lb

## 2017-08-04 DIAGNOSIS — N289 Disorder of kidney and ureter, unspecified: Secondary | ICD-10-CM | POA: Diagnosis not present

## 2017-08-04 DIAGNOSIS — E119 Type 2 diabetes mellitus without complications: Secondary | ICD-10-CM | POA: Diagnosis not present

## 2017-08-04 DIAGNOSIS — E782 Mixed hyperlipidemia: Secondary | ICD-10-CM

## 2017-08-04 MED ORDER — NIACIN ER (ANTIHYPERLIPIDEMIC) 750 MG PO TBCR: 750.0000 mg | EXTENDED_RELEASE_TABLET | Freq: Every day | ORAL | 1 refills | Status: DC

## 2017-08-04 NOTE — Patient Instructions (Addendum)
No Advil and Tricor  Niaspan at bedtime with snack  Metformin 1/2 twice daily for now

## 2017-08-07 ENCOUNTER — Other Ambulatory Visit: Payer: Self-pay

## 2017-08-07 DIAGNOSIS — J40 Bronchitis, not specified as acute or chronic: Secondary | ICD-10-CM | POA: Diagnosis not present

## 2017-08-07 DIAGNOSIS — R300 Dysuria: Secondary | ICD-10-CM | POA: Diagnosis not present

## 2017-08-07 DIAGNOSIS — J329 Chronic sinusitis, unspecified: Secondary | ICD-10-CM | POA: Diagnosis not present

## 2017-08-07 DIAGNOSIS — E1165 Type 2 diabetes mellitus with hyperglycemia: Secondary | ICD-10-CM | POA: Diagnosis not present

## 2017-08-07 DIAGNOSIS — M545 Low back pain: Secondary | ICD-10-CM | POA: Diagnosis not present

## 2017-08-07 MED ORDER — GLUCOSE BLOOD VI STRP: ORAL_STRIP | 3 refills | Status: DC

## 2017-08-18 DIAGNOSIS — E538 Deficiency of other specified B group vitamins: Secondary | ICD-10-CM | POA: Diagnosis not present

## 2017-08-18 DIAGNOSIS — Z23 Encounter for immunization: Secondary | ICD-10-CM | POA: Diagnosis not present

## 2017-08-19 DIAGNOSIS — F331 Major depressive disorder, recurrent, moderate: Secondary | ICD-10-CM | POA: Diagnosis not present

## 2017-08-22 DIAGNOSIS — N289 Disorder of kidney and ureter, unspecified: Secondary | ICD-10-CM | POA: Diagnosis not present

## 2017-08-28 ENCOUNTER — Telehealth: Payer: Self-pay | Admitting: Endocrinology

## 2017-08-28 NOTE — Telephone Encounter (Signed)
Made in error

## 2017-08-28 NOTE — Telephone Encounter (Signed)
Spoke to the patient and she stated she is using [facility] now and needs her prescriptions to be sent to them. The prescriptions are atorvastatin, metformin, and omega 3 and they need to faxed to [phone]

## 2017-08-28 NOTE — Telephone Encounter (Signed)
Please call patient re: medications at ph# [phone]

## 2017-08-29 ENCOUNTER — Other Ambulatory Visit: Payer: Self-pay

## 2017-08-29 MED ORDER — OMEGA-3-ACID ETHYL ESTERS 1 G PO CAPS: 2.0000 g | ORAL_CAPSULE | Freq: Two times a day (BID) | ORAL | 3 refills | Status: DC

## 2017-08-29 MED ORDER — ATORVASTATIN CALCIUM 10 MG PO TABS: 10.0000 mg | ORAL_TABLET | Freq: Every day | ORAL | 1 refills | Status: DC

## 2017-08-29 MED ORDER — METFORMIN HCL 1000 MG PO TABS: ORAL_TABLET | ORAL | 3 refills | Status: DC

## 2017-09-01 ENCOUNTER — Telehealth: Payer: Self-pay

## 2017-09-01 MED ORDER — NIACIN ER (ANTIHYPERLIPIDEMIC) 750 MG PO TBCR: 750.0000 mg | EXTENDED_RELEASE_TABLET | Freq: Every day | ORAL | 1 refills | Status: DC

## 2017-09-01 NOTE — Telephone Encounter (Signed)
error 

## 2017-09-03 ENCOUNTER — Other Ambulatory Visit: Payer: Self-pay

## 2017-09-03 MED ORDER — NIACIN ER (ANTIHYPERLIPIDEMIC) 750 MG PO TBCR: 750.0000 mg | EXTENDED_RELEASE_TABLET | Freq: Every day | ORAL | 1 refills | Status: DC

## 2017-09-05 ENCOUNTER — Other Ambulatory Visit: Payer: Self-pay

## 2017-09-05 MED ORDER — ATORVASTATIN CALCIUM 10 MG PO TABS: 10.0000 mg | ORAL_TABLET | Freq: Every day | ORAL | 1 refills | Status: DC

## 2017-09-05 MED ORDER — OMEGA-3-ACID ETHYL ESTERS 1 G PO CAPS: 2.0000 g | ORAL_CAPSULE | Freq: Two times a day (BID) | ORAL | 3 refills | Status: DC

## 2017-09-05 MED ORDER — METFORMIN HCL 1000 MG PO TABS: ORAL_TABLET | ORAL | 3 refills | Status: DC

## 2017-09-05 NOTE — Telephone Encounter (Signed)
Submitted

## 2017-09-16 DIAGNOSIS — F331 Major depressive disorder, recurrent, moderate: Secondary | ICD-10-CM | POA: Diagnosis not present

## 2017-09-19 DIAGNOSIS — R1110 Vomiting, unspecified: Secondary | ICD-10-CM | POA: Diagnosis not present

## 2017-09-19 DIAGNOSIS — J0100 Acute maxillary sinusitis, unspecified: Secondary | ICD-10-CM | POA: Diagnosis not present

## 2017-09-19 DIAGNOSIS — R509 Fever, unspecified: Secondary | ICD-10-CM | POA: Diagnosis not present

## 2017-09-26 DIAGNOSIS — G4733 Obstructive sleep apnea (adult) (pediatric): Secondary | ICD-10-CM | POA: Diagnosis not present

## 2017-10-06 LAB — HM DIABETES EYE EXAM

## 2017-10-08 DIAGNOSIS — R1084 Generalized abdominal pain: Secondary | ICD-10-CM | POA: Diagnosis not present

## 2017-10-08 DIAGNOSIS — J321 Chronic frontal sinusitis: Secondary | ICD-10-CM | POA: Diagnosis not present

## 2017-10-08 DIAGNOSIS — R110 Nausea: Secondary | ICD-10-CM | POA: Diagnosis not present

## 2017-10-08 DIAGNOSIS — N3001 Acute cystitis with hematuria: Secondary | ICD-10-CM | POA: Diagnosis not present

## 2017-10-08 DIAGNOSIS — K6389 Other specified diseases of intestine: Secondary | ICD-10-CM | POA: Diagnosis not present

## 2017-10-09 DIAGNOSIS — R109 Unspecified abdominal pain: Secondary | ICD-10-CM | POA: Diagnosis not present

## 2017-10-09 DIAGNOSIS — K668 Other specified disorders of peritoneum: Secondary | ICD-10-CM | POA: Diagnosis not present

## 2017-10-23 DIAGNOSIS — E785 Hyperlipidemia, unspecified: Secondary | ICD-10-CM | POA: Diagnosis not present

## 2017-10-23 DIAGNOSIS — E119 Type 2 diabetes mellitus without complications: Secondary | ICD-10-CM | POA: Diagnosis not present

## 2017-10-24 DIAGNOSIS — K602 Anal fissure, unspecified: Secondary | ICD-10-CM | POA: Diagnosis not present

## 2017-10-24 DIAGNOSIS — R159 Full incontinence of feces: Secondary | ICD-10-CM | POA: Diagnosis not present

## 2017-10-24 DIAGNOSIS — K529 Noninfective gastroenteritis and colitis, unspecified: Secondary | ICD-10-CM | POA: Diagnosis not present

## 2017-10-24 LAB — COMPREHENSIVE METABOLIC PANEL
A/G RATIO: 1.5 (ref 1.2–2.2)
ALBUMIN: 4.3 g/dL (ref 3.6–4.8)
ALT: 17 IU/L (ref 0–32)
AST: 25 IU/L (ref 0–40)
Alkaline Phosphatase: 97 IU/L (ref 39–117)
BILIRUBIN TOTAL: 0.2 mg/dL (ref 0.0–1.2)
BUN / CREAT RATIO: 10 — AB (ref 12–28)
BUN: 12 mg/dL (ref 8–27)
CHLORIDE: 108 mmol/L — AB (ref 96–106)
CO2: 18 mmol/L — ABNORMAL LOW (ref 20–29)
Calcium: 9.2 mg/dL (ref 8.7–10.3)
Creatinine, Ser: 1.19 mg/dL — ABNORMAL HIGH (ref 0.57–1.00)
GFR calc non Af Amer: 49 mL/min/{1.73_m2} — ABNORMAL LOW (ref >59)
GFR, EST AFRICAN AMERICAN: 56 mL/min/{1.73_m2} — AB (ref >59)
Globulin, Total: 2.8 g/dL (ref 1.5–4.5)
Glucose: 125 mg/dL — ABNORMAL HIGH (ref 65–99)
POTASSIUM: 4.9 mmol/L (ref 3.5–5.2)
Sodium: 145 mmol/L — ABNORMAL HIGH (ref 134–144)
Total Protein: 7.1 g/dL (ref 6.0–8.5)

## 2017-10-24 LAB — LIPID PANEL
CHOL/HDL RATIO: 3 ratio (ref 0.0–4.4)
Cholesterol, Total: 106 mg/dL (ref 100–199)
HDL: 35 mg/dL — AB (ref >39)
LDL CALC: 38 mg/dL (ref 0–99)
TRIGLYCERIDES: 165 mg/dL — AB (ref 0–149)
VLDL CHOLESTEROL CAL: 33 mg/dL (ref 5–40)

## 2017-10-24 LAB — HGB A1C W/O EAG: Hgb A1c MFr Bld: 5.5 % (ref 4.8–5.6)

## 2017-10-24 LAB — SPECIMEN STATUS REPORT

## 2017-10-30 DIAGNOSIS — D649 Anemia, unspecified: Secondary | ICD-10-CM | POA: Diagnosis not present

## 2017-11-04 ENCOUNTER — Ambulatory Visit: Payer: Medicare Other | Admitting: Endocrinology

## 2017-11-04 ENCOUNTER — Other Ambulatory Visit: Payer: Self-pay

## 2017-11-04 ENCOUNTER — Encounter: Payer: Self-pay | Admitting: Endocrinology

## 2017-11-04 VITALS — BP 100/64 | HR 82 | Ht 64.0 in | Wt 183.0 lb

## 2017-11-04 DIAGNOSIS — E782 Mixed hyperlipidemia: Secondary | ICD-10-CM

## 2017-11-04 DIAGNOSIS — E119 Type 2 diabetes mellitus without complications: Secondary | ICD-10-CM

## 2017-11-04 MED ORDER — ONETOUCH DELICA LANCETS FINE MISC: 3 refills | Status: DC

## 2017-11-04 NOTE — Patient Instructions (Addendum)
May stop Omega 3  Get new strips  Increase fluids  Next Rx Metformin is ER

## 2017-11-04 NOTE — Progress Notes (Signed)
Patient ID: Kristin Olson, female   DOB: 10/03/1956, 62 y.o.   MRN: [id number]   Reason for Appointment: Endocrinology follow-up   History of Present Illness   Diagnosis: Type 2 DIABETES MELITUS, date of diagnosis: 1999      She has had mild diabetes for several years which has been usually well controlled with metformin alone, currently taking 1 g twice a day. .  A1c is   again lower than expected for her blood sugars, now 5.5 Previous range 4.9-5.9  Current blood sugars, problems identified and self management:  She is checking blood sugars sporadically but she says her test strips are out of date  Highest blood sugar was 161 a couple of weeks ago late morning   Until recently has not done any exercise but is going to start going to the [facility] now  Has been able to maintain her weight to about the same  She thinks she is trying to avoid regular soft drinks and overall trying to do better on her diet especially recently  Although she has taken metformin without side effects for quite some time she appears to had diarrhea recently being treated by gastroenterologist  Lab fasting glucose is 125 fasting  Monitors blood glucose:  infrequently     Glucometer: One Touch.     Home glucose levels: Recent range 111-161   Oral hypoglycemic drugs: Metformin 1 g twice a day        Side effects from medications: None            Wt Readings from Last 3 Encounters:  11/04/17 183 lb (83 kg)  08/04/17 182 lb 12.8 oz (82.9 kg)  03/31/17 180 lb 3.2 oz (81.7 kg)   Diet: May have eggs/Bacon and toast in the morning   Physical activity: [facility]          Lab Results  Component Value Date   HGBA1C 5.5 10/23/2017   HGBA1C 5.3 07/28/2017   HGBA1C 4.9 03/12/2017   Lab Results  Component Value Date   LDLCALC 38 10/23/2017   CREATININE 1.19 (H) 10/23/2017     Microalbumin normal in 6/18  PRIOR history:  Because of somewhat higher blood sugars and weight gain  she was tried on Byetta in 2006 and subsequently Victoza. Previously Victoza had helped her with portion control and weight loss.    However because she had difficulty with nausea when she was taking this it was stopped, also  could not afford this because of insurance noncoverage; she stopped taking this in 2014    OTHER active problems: See review of systems Abnormal liver   Allergies as of 11/04/2017      Reactions   Levaquin [levofloxacin In D5w] Itching      Medication List        Accurate as of 11/04/17 12:50 PM. Always use your most recent med list.          atorvastatin 10 MG tablet Commonly known as:  LIPITOR Take 1 tablet (10 mg total) daily by mouth.   cholestyramine light 4 g packet Commonly known as:  PREVALITE   diazepam 5 MG tablet Commonly known as:  VALIUM Take 5 mg by mouth 3 (three) times daily.   fenofibrate 145 MG tablet Commonly known as:  TRICOR Take 1 tablet (145 mg total) by mouth daily.   gabapentin 400 MG capsule Commonly known as:  NEURONTIN Take 400 mg by mouth 2 (two) times daily.  glucose blood test strip Commonly known as:  ONE TOUCH ULTRA TEST Use as instructed to check blood sugar 2 times per day dx code E11.65   ibuprofen 800 MG tablet Commonly known as:  ADVIL,MOTRIN Take 800 mg by mouth every 8 (eight) hours as needed.   metFORMIN 1000 MG tablet Commonly known as:  GLUCOPHAGE TAKE 1 BY MOUTH TWICE DAILY   mirtazapine 7.5 MG tablet Commonly known as:  REMERON Take 2 mg by mouth at bedtime.   niacin 750 MG CR tablet Commonly known as:  NIASPAN Take 1 tablet (750 mg total) at bedtime by mouth.   omega-3 acid ethyl esters 1 g capsule Commonly known as:  LOVAZA Take 2 capsules (2 g total) 2 (two) times daily by mouth.   omeprazole 20 MG capsule Commonly known as:  PRILOSEC 20 mg.   ONETOUCH DELICA LANCETS FINE Misc Use twice a day dx code E11.65   oxybutynin 15 MG 24 hr tablet Commonly known as:  DITROPAN XL Take  15 mg by mouth 2 (two) times daily.   risperiDONE 2 MG tablet Commonly known as:  RISPERDAL Take 2 mg by mouth once.   sertraline 100 MG tablet Commonly known as:  ZOLOFT Take 100 mg by mouth daily. 2 tablets by mouth daily   SUMAtriptan 100 MG tablet Commonly known as:  IMITREX Take 100 mg by mouth every 4 (four) hours as needed for migraine.   trimethoprim 100 MG tablet Commonly known as:  TRIMPEX Take 100 mg by mouth 2 (two) times daily.       History reviewed. No pertinent past medical history.  History reviewed. No pertinent surgical history.  Family History  Problem Relation Age of Onset  . Diabetes Mother   . Diabetes Maternal Grandfather     Social History:  reports that  has never smoked. she has never used smokeless tobacco. Her alcohol and drug histories are not on file.  Allergies:  Allergies  Allergen Reactions  . Levaquin [Levofloxacin In D5w] Itching     Review of systems:   Her creatinine is back to normal with cutting out ibuprofen as discussed on her last visit  She is now taking cholestyramine, 3 scoops a day for her diarrhea from gastroenterologist   HYPERLIPIDEMIA: She has had high LDL, high triglycerides as also LDL particle number  and low HDL.   Previously her particle number was excellent at 555  She is taking Lipitor 10 mg daily and her LDL levels are excellent Triglycerides were higher with using Lovaza and Niaspan was started, she tolerates this well and not taking aspirin Triglycerides are much improved Not taking fenofibrate now Also HDL is better   Lab Results  Component Value Date   CHOL 106 10/23/2017   CHOL 125 07/28/2017   CHOL 139 03/12/2017   Lab Results  Component Value Date   HDL 35 (L) 10/23/2017   HDL 25 (L) 07/28/2017   HDL 22 (L) 03/12/2017   Lab Results  Component Value Date   LDLCALC 38 10/23/2017   LDLCALC 46 07/28/2017   LDLCALC 62 03/12/2017   Lab Results  Component Value Date   TRIG 165 (H)  10/23/2017   TRIG 270 (H) 07/28/2017   TRIG 276 (H) 03/12/2017   Lab Results  Component Value Date   CHOLHDL 3.0 10/23/2017   CHOLHDL 5.0 (H) 07/28/2017   CHOLHDL 6.3 (H) 03/12/2017   No results found for: LDLDIRECT  She has had problems with depression which is long-standing,  taking Risperdal with control   She  had several surgeries because of biliary duct obstruction She has been treated at [facility] Alkaline phosphatase as follows:   Lab Results  Component Value Date   ALKPHOS 97 10/23/2017       Examination:   BP 100/64   Pulse 82   Ht 5\' 4"  (1.626 m)   Wt 183 lb (83 kg)   SpO2 96%   BMI 31.41 kg/m   Body mass index is 31.41 kg/m.    Assesment/PLAN:  Diabetes type 2, Mild and with Consistently good control   She has been using metformin twice a day, 2 g total and blood sugars are relatively well controlled although A1c is lower than expected at 5.5 She is not using within date test strips and no readings after meals Lab fasting glucose 125 Her weight is up slightly and so far has not done consistently well with diet or exercise However she is going to start exercising at the [facility] She is at least trying to cut back on regular soft drinks   HYPERLIPIDEMIA: Her LDL is controlled, triglycerides improved with Niaspan now Since she is complaining about cost of Lovaza she can do this now since it was not controlling triglycerides He will continue Lipitor   Follow-up in 4 months   Jamesen Stahnke 11/04/2017, 12:50 PM

## 2017-11-06 DIAGNOSIS — K6289 Other specified diseases of anus and rectum: Secondary | ICD-10-CM | POA: Diagnosis not present

## 2017-11-07 DIAGNOSIS — N3000 Acute cystitis without hematuria: Secondary | ICD-10-CM | POA: Diagnosis not present

## 2017-11-07 DIAGNOSIS — J329 Chronic sinusitis, unspecified: Secondary | ICD-10-CM | POA: Diagnosis not present

## 2017-11-10 DIAGNOSIS — F331 Major depressive disorder, recurrent, moderate: Secondary | ICD-10-CM | POA: Diagnosis not present

## 2017-11-13 ENCOUNTER — Encounter: Payer: Self-pay | Admitting: Endocrinology

## 2017-11-19 DIAGNOSIS — F3341 Major depressive disorder, recurrent, in partial remission: Secondary | ICD-10-CM | POA: Diagnosis not present

## 2017-12-01 DIAGNOSIS — N3000 Acute cystitis without hematuria: Secondary | ICD-10-CM | POA: Diagnosis not present

## 2017-12-01 DIAGNOSIS — J09X2 Influenza due to identified novel influenza A virus with other respiratory manifestations: Secondary | ICD-10-CM | POA: Diagnosis not present

## 2017-12-01 DIAGNOSIS — E538 Deficiency of other specified B group vitamins: Secondary | ICD-10-CM | POA: Diagnosis not present

## 2017-12-08 DIAGNOSIS — M549 Dorsalgia, unspecified: Secondary | ICD-10-CM | POA: Diagnosis not present

## 2017-12-08 DIAGNOSIS — W19XXXA Unspecified fall, initial encounter: Secondary | ICD-10-CM | POA: Diagnosis not present

## 2017-12-08 DIAGNOSIS — S336XXA Sprain of sacroiliac joint, initial encounter: Secondary | ICD-10-CM | POA: Diagnosis not present

## 2017-12-08 DIAGNOSIS — M25551 Pain in right hip: Secondary | ICD-10-CM | POA: Diagnosis not present

## 2017-12-22 DIAGNOSIS — N3001 Acute cystitis with hematuria: Secondary | ICD-10-CM | POA: Diagnosis not present

## 2017-12-22 DIAGNOSIS — M25551 Pain in right hip: Secondary | ICD-10-CM | POA: Diagnosis not present

## 2017-12-22 DIAGNOSIS — M25552 Pain in left hip: Secondary | ICD-10-CM | POA: Diagnosis not present

## 2017-12-30 DIAGNOSIS — E538 Deficiency of other specified B group vitamins: Secondary | ICD-10-CM | POA: Diagnosis not present

## 2018-01-05 DIAGNOSIS — G4733 Obstructive sleep apnea (adult) (pediatric): Secondary | ICD-10-CM | POA: Diagnosis not present

## 2018-01-17 DIAGNOSIS — R300 Dysuria: Secondary | ICD-10-CM | POA: Diagnosis not present

## 2018-01-17 DIAGNOSIS — M5430 Sciatica, unspecified side: Secondary | ICD-10-CM | POA: Diagnosis not present

## 2018-01-20 DIAGNOSIS — M5416 Radiculopathy, lumbar region: Secondary | ICD-10-CM | POA: Diagnosis not present

## 2018-01-22 DIAGNOSIS — M545 Low back pain: Secondary | ICD-10-CM | POA: Diagnosis not present

## 2018-01-22 DIAGNOSIS — M5416 Radiculopathy, lumbar region: Secondary | ICD-10-CM | POA: Diagnosis not present

## 2018-01-22 DIAGNOSIS — M47816 Spondylosis without myelopathy or radiculopathy, lumbar region: Secondary | ICD-10-CM | POA: Diagnosis not present

## 2018-01-23 DIAGNOSIS — K6289 Other specified diseases of anus and rectum: Secondary | ICD-10-CM | POA: Diagnosis not present

## 2018-01-23 DIAGNOSIS — K529 Noninfective gastroenteritis and colitis, unspecified: Secondary | ICD-10-CM | POA: Diagnosis not present

## 2018-01-30 DIAGNOSIS — E538 Deficiency of other specified B group vitamins: Secondary | ICD-10-CM | POA: Diagnosis not present

## 2018-02-03 DIAGNOSIS — F331 Major depressive disorder, recurrent, moderate: Secondary | ICD-10-CM | POA: Diagnosis not present

## 2018-02-17 ENCOUNTER — Other Ambulatory Visit: Payer: Self-pay | Admitting: Endocrinology

## 2018-02-17 DIAGNOSIS — E119 Type 2 diabetes mellitus without complications: Secondary | ICD-10-CM | POA: Diagnosis not present

## 2018-02-17 DIAGNOSIS — E782 Mixed hyperlipidemia: Secondary | ICD-10-CM | POA: Diagnosis not present

## 2018-02-18 LAB — LIPID PANEL
CHOL/HDL RATIO: 4.4 ratio (ref 0.0–4.4)
Cholesterol, Total: 109 mg/dL (ref 100–199)
HDL: 25 mg/dL — ABNORMAL LOW (ref >39)
LDL Calculated: 14 mg/dL (ref 0–99)
TRIGLYCERIDES: 351 mg/dL — AB (ref 0–149)
VLDL Cholesterol Cal: 70 mg/dL — ABNORMAL HIGH (ref 5–40)

## 2018-02-18 LAB — COMPREHENSIVE METABOLIC PANEL
ALT: 18 IU/L (ref 0–32)
AST: 25 IU/L (ref 0–40)
Albumin/Globulin Ratio: 1.9 (ref 1.2–2.2)
Albumin: 4.3 g/dL (ref 3.6–4.8)
Alkaline Phosphatase: 147 IU/L — ABNORMAL HIGH (ref 39–117)
BUN/Creatinine Ratio: 12 (ref 12–28)
BUN: 11 mg/dL (ref 8–27)
Bilirubin Total: <0.2 mg/dL (ref 0.0–1.2)
CALCIUM: 9.3 mg/dL (ref 8.7–10.3)
CO2: 22 mmol/L (ref 20–29)
Chloride: 107 mmol/L — ABNORMAL HIGH (ref 96–106)
Creatinine, Ser: 0.95 mg/dL (ref 0.57–1.00)
GFR, EST AFRICAN AMERICAN: 73 mL/min/{1.73_m2} (ref >59)
GFR, EST NON AFRICAN AMERICAN: 63 mL/min/{1.73_m2} (ref >59)
GLOBULIN, TOTAL: 2.3 g/dL (ref 1.5–4.5)
Glucose: 118 mg/dL — ABNORMAL HIGH (ref 65–99)
Potassium: 4.7 mmol/L (ref 3.5–5.2)
SODIUM: 143 mmol/L (ref 134–144)
TOTAL PROTEIN: 6.6 g/dL (ref 6.0–8.5)

## 2018-02-18 LAB — HEMOGLOBIN A1C
ESTIMATED AVERAGE GLUCOSE: 103 mg/dL
Hgb A1c MFr Bld: 5.2 % (ref 4.8–5.6)

## 2018-03-02 DIAGNOSIS — E538 Deficiency of other specified B group vitamins: Secondary | ICD-10-CM | POA: Diagnosis not present

## 2018-03-03 NOTE — Progress Notes (Signed)
Patient ID: Kristin Olson, female   DOB: 23-Jan-1956, 62 y.o.   MRN: [id number]   Reason for Appointment: Endocrinology follow-up   History of Present Illness   Diagnosis: Type 2 DIABETES MELITUS, date of diagnosis: 1999      She has had mild diabetes for several years which has been usually well controlled with metformin alone, currently taking 1 g twice a day. .  A1c is   again lower than expected for her blood sugars, recently 5.2  Previous range 4.9-5.9  Current blood sugars, problems identified and self management:  She has lost weight and not sure why  She has been having much more pain over the last 3 months with her back and leg is not able to do much activity  She has been able to eliminate regular soft drinks and generally watching her diet  Sugars will be monitored only occasionally in the morning and midday and usually not after meals  Lab glucose was 118  Monitors blood glucose:  infrequently     Glucometer: One Touch.     Home glucose levels: Recent range 90-152 with 6 readings and average 124    Oral hypoglycemic drugs: Metformin 1 g twice a day        Side effects from medications: None            Wt Readings from Last 3 Encounters:  03/04/18 173 lb 3.2 oz (78.6 kg)  11/04/17 183 lb (83 kg)  08/04/17 182 lb 12.8 oz (82.9 kg)     Lab Results  Component Value Date   HGBA1C 5.2 02/17/2018   HGBA1C 5.5 10/23/2017   HGBA1C 5.3 07/28/2017   Lab Results  Component Value Date   LDLCALC 14 02/17/2018   CREATININE 0.95 02/17/2018     Microalbumin normal in 6/18  PRIOR history:  Because of somewhat higher blood sugars and weight gain she was tried on Byetta in 2006 and subsequently Victoza. Previously Victoza had helped her with portion control and weight loss.    However because she had difficulty with nausea when she was taking this it was stopped, also  could not afford this because of insurance noncoverage; she stopped taking this in 2014     OTHER active problems: See review of systems     Allergies as of 03/04/2018      Reactions   Levaquin [levofloxacin In D5w] Itching      Medication List        Accurate as of 03/04/18  8:29 AM. Always use your most recent med list.          atorvastatin 10 MG tablet Commonly known as:  LIPITOR Take 1 tablet (10 mg total) daily by mouth.   cholestyramine light 4 g packet Commonly known as:  PREVALITE   diazepam 5 MG tablet Commonly known as:  VALIUM Take 5 mg by mouth 3 (three) times daily.   fenofibrate 145 MG tablet Commonly known as:  TRICOR Take 1 tablet (145 mg total) by mouth daily.   gabapentin 400 MG capsule Commonly known as:  NEURONTIN Take 400 mg by mouth 2 (two) times daily.   glucose blood test strip Commonly known as:  ONE TOUCH ULTRA TEST Use as instructed to check blood sugar 2 times per day dx code E11.65   metFORMIN 1000 MG tablet Commonly known as:  GLUCOPHAGE TAKE 1 BY MOUTH TWICE DAILY   mirtazapine 7.5 MG tablet Commonly known as:  REMERON Take 2 mg by  mouth at bedtime.   niacin 750 MG CR tablet Commonly known as:  NIASPAN Take 1 tablet (750 mg total) at bedtime by mouth.   omeprazole 20 MG capsule Commonly known as:  PRILOSEC 20 mg.   ONETOUCH DELICA LANCETS FINE Misc Use twice a day dx code E11.65   oxybutynin 15 MG 24 hr tablet Commonly known as:  DITROPAN XL Take 15 mg by mouth 2 (two) times daily.   risperiDONE 2 MG tablet Commonly known as:  RISPERDAL Take 2 mg by mouth once.   sertraline 100 MG tablet Commonly known as:  ZOLOFT Take 100 mg by mouth daily. 2 tablets by mouth daily   SUMAtriptan 100 MG tablet Commonly known as:  IMITREX Take 100 mg by mouth every 4 (four) hours as needed for migraine.   trimethoprim 100 MG tablet Commonly known as:  TRIMPEX Take 100 mg by mouth 2 (two) times daily.       No past medical history on file.  No past surgical history on file.  Family History  Problem  Relation Age of Onset  . Diabetes Mother   . Diabetes Maternal Grandfather     Social History:  reports that she has never smoked. She has never used smokeless tobacco. Her alcohol and drug histories are not on file.  Allergies:  Allergies  Allergen Reactions  . Levaquin [Levofloxacin In D5w] Itching     Review of systems:   She is now taking cholestyramine, 2 scoops a day for her diarrhea from gastroenterologist  HYPERLIPIDEMIA: She has had high LDL, high triglycerides as also LDL particle number  and low HDL.   Previously her particle number was excellent at 555  She is taking Lipitor 10 mg daily and her LDL levels are controlled  Triglycerides are now higher with using fenofibrate and Niaspan This may be related to her not taking Lovaza as it is too expensive for her Does not think her diet has been any worse and she has lost weight   Lab Results  Component Value Date   CHOL 109 02/17/2018   CHOL 106 10/23/2017   CHOL 125 07/28/2017   Lab Results  Component Value Date   HDL 25 (L) 02/17/2018   HDL 35 (L) 10/23/2017   HDL 25 (L) 07/28/2017   Lab Results  Component Value Date   LDLCALC 14 02/17/2018   LDLCALC 38 10/23/2017   LDLCALC 46 07/28/2017   Lab Results  Component Value Date   TRIG 351 (H) 02/17/2018   TRIG 165 (H) 10/23/2017   TRIG 270 (H) 07/28/2017   Lab Results  Component Value Date   CHOLHDL 4.4 02/17/2018   CHOLHDL 3.0 10/23/2017   CHOLHDL 5.0 (H) 07/28/2017   No results found for: LDLDIRECT  Treated for depression which is long-standing, taking Risperdal with control   She  had several 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    Assesment/PLAN:  Diabetes type 2, Mild and with Consistently good control    Her A1c tends to  be falsely low  Her lab glucose was 118 and she has readings as high as 152 at home but not monitoring much after meals She is not able to do any exercise because of her back pain and she is waiting for treatment for this  Lab fasting glucose 118  We will continue metformin unchanged and follow-up in 4 months Check microalbumin today   HYPERLIPIDEMIA: Her triglycerides are much higher even though LDL is controlled She appears to have had benefit from taking Lovaza previously but apparently is inadequate coverage for this from her insurance We will try this again as a prescription but otherwise she can try OTC fish oil, 4 g daily   Follow-up in 4 months with fasting labs   Reather Littler 03/04/2018, 8:29 AM

## 2018-03-04 ENCOUNTER — Encounter: Payer: Self-pay | Admitting: Endocrinology

## 2018-03-04 ENCOUNTER — Ambulatory Visit: Payer: Medicare Other | Admitting: Endocrinology

## 2018-03-04 VITALS — BP 118/74 | HR 96 | Ht 64.0 in | Wt 173.2 lb

## 2018-03-04 DIAGNOSIS — E782 Mixed hyperlipidemia: Secondary | ICD-10-CM

## 2018-03-04 DIAGNOSIS — L601 Onycholysis: Secondary | ICD-10-CM | POA: Diagnosis not present

## 2018-03-04 DIAGNOSIS — E119 Type 2 diabetes mellitus without complications: Secondary | ICD-10-CM | POA: Diagnosis not present

## 2018-03-04 DIAGNOSIS — L301 Dyshidrosis [pompholyx]: Secondary | ICD-10-CM | POA: Diagnosis not present

## 2018-03-04 LAB — MICROALBUMIN / CREATININE URINE RATIO
Creatinine,U: 129.8 mg/dL
Microalb Creat Ratio: 1.8 mg/g (ref 0.0–30.0)
Microalb, Ur: 2.4 mg/dL — ABNORMAL HIGH (ref 0.0–1.9)

## 2018-03-04 MED ORDER — OMEGA-3-ACID ETHYL ESTERS 1 G PO CAPS: ORAL_CAPSULE | ORAL | 2 refills | Status: DC

## 2018-03-04 NOTE — Patient Instructions (Addendum)
Stop Fenofibrate if able to get Lovaza/ omega 3

## 2018-04-10 DIAGNOSIS — Z01818 Encounter for other preprocedural examination: Secondary | ICD-10-CM | POA: Diagnosis not present

## 2018-06-26 ENCOUNTER — Other Ambulatory Visit: Payer: Self-pay | Admitting: Endocrinology

## 2018-06-30 LAB — COMPREHENSIVE METABOLIC PANEL WITH GFR
ALT: 28 IU/L (ref 0–32)
AST: 35 IU/L (ref 0–40)
Albumin/Globulin Ratio: 1.5 (ref 1.2–2.2)
Albumin: 4 g/dL (ref 3.6–4.8)
Alkaline Phosphatase: 404 IU/L — ABNORMAL HIGH (ref 39–117)
BUN/Creatinine Ratio: 15 (ref 12–28)
BUN: 15 mg/dL (ref 8–27)
Bilirubin Total: 0.3 mg/dL (ref 0.0–1.2)
CO2: 25 mmol/L (ref 20–29)
Calcium: 9.1 mg/dL (ref 8.7–10.3)
Chloride: 104 mmol/L (ref 96–106)
Creatinine, Ser: 0.97 mg/dL (ref 0.57–1.00)
GFR calc Af Amer: 71 mL/min/1.73 (ref >59)
GFR calc non Af Amer: 62 mL/min/1.73 (ref >59)
Globulin, Total: 2.6 g/dL (ref 1.5–4.5)
Glucose: 109 mg/dL — ABNORMAL HIGH (ref 65–99)
Potassium: 4.4 mmol/L (ref 3.5–5.2)
Sodium: 140 mmol/L (ref 134–144)
Total Protein: 6.6 g/dL (ref 6.0–8.5)

## 2018-06-30 LAB — LIPID PANEL
Chol/HDL Ratio: 3.2 ratio (ref 0.0–4.4)
Cholesterol, Total: 105 mg/dL (ref 100–199)
HDL: 33 mg/dL — ABNORMAL LOW (ref >39)
LDL Calculated: 42 mg/dL (ref 0–99)
Triglycerides: 150 mg/dL — ABNORMAL HIGH (ref 0–149)
VLDL Cholesterol Cal: 30 mg/dL (ref 5–40)

## 2018-06-30 LAB — HEMOGLOBIN A1C
Est. average glucose Bld gHb Est-mCnc: 103 mg/dL
Hgb A1c MFr Bld: 5.2 % (ref 4.8–5.6)

## 2018-07-07 ENCOUNTER — Ambulatory Visit: Payer: Medicare Other | Admitting: Endocrinology

## 2018-07-07 LAB — HM DIABETES EYE EXAM

## 2018-07-31 ENCOUNTER — Encounter: Payer: Self-pay | Admitting: *Deleted

## 2018-09-16 ENCOUNTER — Other Ambulatory Visit: Payer: Self-pay | Admitting: Endocrinology

## 2018-09-19 LAB — LIPID PANEL
Chol/HDL Ratio: 3.8 ratio (ref 0.0–4.4)
Cholesterol, Total: 119 mg/dL (ref 100–199)
HDL: 31 mg/dL — ABNORMAL LOW (ref >39)
LDL CALC: 58 mg/dL (ref 0–99)
Triglycerides: 149 mg/dL (ref 0–149)
VLDL Cholesterol Cal: 30 mg/dL (ref 5–40)

## 2018-09-19 LAB — COMPREHENSIVE METABOLIC PANEL
A/G RATIO: 1.7 (ref 1.2–2.2)
ALBUMIN: 4.1 g/dL (ref 3.6–4.8)
ALK PHOS: 243 IU/L — AB (ref 39–117)
ALT: 23 IU/L (ref 0–32)
AST: 31 IU/L (ref 0–40)
BILIRUBIN TOTAL: 0.3 mg/dL (ref 0.0–1.2)
BUN / CREAT RATIO: 12 (ref 12–28)
BUN: 13 mg/dL (ref 8–27)
CHLORIDE: 105 mmol/L (ref 96–106)
CO2: 25 mmol/L (ref 20–29)
CREATININE: 1.13 mg/dL — AB (ref 0.57–1.00)
Calcium: 8.7 mg/dL (ref 8.7–10.3)
GFR calc Af Amer: 59 mL/min/{1.73_m2} — ABNORMAL LOW (ref >59)
GFR calc non Af Amer: 51 mL/min/{1.73_m2} — ABNORMAL LOW (ref >59)
GLOBULIN, TOTAL: 2.4 g/dL (ref 1.5–4.5)
Glucose: 93 mg/dL (ref 65–99)
POTASSIUM: 3.8 mmol/L (ref 3.5–5.2)
SODIUM: 142 mmol/L (ref 134–144)
Total Protein: 6.5 g/dL (ref 6.0–8.5)

## 2018-09-19 LAB — HEMOGLOBIN A1C
ESTIMATED AVERAGE GLUCOSE: 108 mg/dL
HEMOGLOBIN A1C: 5.4 % (ref 4.8–5.6)

## 2018-09-21 ENCOUNTER — Encounter: Payer: Self-pay | Admitting: Endocrinology

## 2018-09-21 ENCOUNTER — Other Ambulatory Visit: Payer: Self-pay

## 2018-09-21 ENCOUNTER — Ambulatory Visit: Payer: Medicare Other | Admitting: Endocrinology

## 2018-09-21 VITALS — BP 112/80 | HR 67 | Ht 64.0 in | Wt 172.0 lb

## 2018-09-21 DIAGNOSIS — E119 Type 2 diabetes mellitus without complications: Secondary | ICD-10-CM

## 2018-09-21 DIAGNOSIS — E782 Mixed hyperlipidemia: Secondary | ICD-10-CM | POA: Diagnosis not present

## 2018-09-21 MED ORDER — GLUCOSE BLOOD VI STRP: ORAL_STRIP | 3 refills | Status: DC

## 2018-09-21 MED ORDER — ONETOUCH DELICA LANCETS FINE MISC: 3 refills | Status: DC

## 2018-09-21 NOTE — Patient Instructions (Signed)
Stop Fenofibrate.  

## 2018-09-21 NOTE — Progress Notes (Signed)
Patient ID: Kristin Olson, female   DOB: July 10, 1956, 62 y.o.   MRN: [id number]   Reason for Appointment: Endocrinology follow-up   History of Present Illness   Diagnosis: Type 2 DIABETES MELITUS, date of diagnosis: 1999      She has had mild diabetes for several years which has been usually well controlled with metformin alone, currently taking 1 g twice a day. .  A1c is   again lower than expected for her blood sugars, now 5.4  Current blood sugars, problems identified and self management:  She checks her blood sugars only very rarely and has only 3 readings for the last month has lost weight and not sure why  However she is able to keep her weight down and is trying to be more active  Blood sugars at home and in the lab are fairly close to normal  No side effects from metformin currently  Has done fairly well with trying to eliminate sweet drinks and moderate her portions  Exercise regimen: She is trying to go to [facility] 2-3 times a week now  Monitors blood glucose:  infrequently     Glucometer: One Touch.     Home glucose levels: Recent range 103-147 and only 3 readings, higher reading after evening meal   Oral hypoglycemic drugs: Metformin 1 g twice a day        Side effects from medications: None            Wt Readings from Last 3 Encounters:  09/21/18 172 lb (78 kg)  03/04/18 173 lb 3.2 oz (78.6 kg)  11/04/17 183 lb (83 kg)     Lab Results  Component Value Date   HGBA1C 5.4 09/16/2018   HGBA1C 5.2 06/29/2018   HGBA1C 5.2 02/17/2018   Lab Results  Component Value Date   MICROALBUR 2.4 (H) 03/04/2018   LDLCALC 58 09/16/2018   CREATININE 1.13 (H) 09/16/2018     Microalbumin normal in 6/18  PRIOR history:  Because of somewhat higher blood sugars and weight gain she was tried on Byetta in 2006 and subsequently Victoza. Previously Victoza had helped her with portion control and weight loss.    However because she had  difficulty with nausea when she was taking this it was stopped, also  could not afford this because of insurance noncoverage; she stopped taking this in 2014    OTHER active problems: See review of systems     Allergies as of 09/21/2018      Reactions   Levaquin [levofloxacin In D5w] Itching      Medication List        Accurate as of 09/21/18  8:40 AM. Always use your most recent med list.          atorvastatin 10 MG tablet Commonly known as:  LIPITOR Take 1 tablet by mouth daily.   cholestyramine light 4 g packet Commonly known as:  PREVALITE   diazepam 5 MG tablet Commonly known as:  VALIUM Take 5 mg by mouth 3 (three) times daily.   fenofibrate 145 MG tablet Commonly known as:  TRICOR Take 1 tablet (145 mg total) by mouth daily.   gabapentin 400 MG capsule Commonly known as:  NEURONTIN Take 400 mg by mouth 2 (two) times daily.   glucose blood test strip Use as instructed to check blood sugar 2 times per day dx code E11.65   metFORMIN 1000 MG tablet Commonly known as:  GLUCOPHAGE Take 1  tablet by mouth twice daily.   mirtazapine 7.5 MG tablet Commonly known as:  REMERON Take 2 mg by mouth at bedtime.   niacin 750 MG CR tablet Commonly known as:  NIASPAN Take 1 tablet (750 mg total) at bedtime by mouth.   omega-3 acid ethyl esters 1 g capsule Commonly known as:  LOVAZA 2 caps bid   omeprazole 20 MG capsule Commonly known as:  PRILOSEC 20 mg.   ONETOUCH DELICA LANCETS FINE Misc Use twice a day dx code E11.65   oxybutynin 15 MG 24 hr tablet Commonly known as:  DITROPAN XL Take 15 mg by mouth 2 (two) times daily.   risperiDONE 2 MG tablet Commonly known as:  RISPERDAL Take 2 mg by mouth once.   sertraline 100 MG tablet Commonly known as:  ZOLOFT Take 100 mg by mouth daily. 2 tablets by mouth daily   SUMAtriptan 100 MG tablet Commonly known as:  IMITREX Take 100 mg by mouth every 4 (four) hours as needed for migraine.   trimethoprim 100 MG  tablet Commonly known as:  TRIMPEX Take 100 mg by mouth 2 (two) times daily.       History reviewed. No pertinent past medical history.  History reviewed. No pertinent surgical history.  Family History  Problem Relation Age of Onset  . Diabetes Mother   . Diabetes Maternal Grandfather     Social History:  reports that she has never smoked. She has never used smokeless tobacco. Her alcohol and drug histories are not on file.  Allergies:  Allergies  Allergen Reactions  . Levaquin [Levofloxacin In D5w] Itching     Review of systems:   She is still taking cholestyramine, 2 scoops a day for her diarrhea from gastroenterologist, does not always get diarrhea if she misses a dose  HYPERLIPIDEMIA: She has had high LDL, high triglycerides as also LDL particle number  and low HDL.   Previously her particle number was excellent at 555  She is taking Lipitor 10 mg daily and her LDL levels are controlled  Triglycerides are now better with using Lovaza and Niaspan Previously had been given fenofibrate also but triglycerides had been higher when she was taking this  Has kept her weight down and diet is fairly good   Lab Results  Component Value Date   CHOL 119 09/16/2018   CHOL 105 06/29/2018   CHOL 109 02/17/2018   Lab Results  Component Value Date   HDL 31 (L) 09/16/2018   HDL 33 (L) 06/29/2018   HDL 25 (L) 02/17/2018   Lab Results  Component Value Date   LDLCALC 58 09/16/2018   LDLCALC 42 06/29/2018   LDLCALC 14 02/17/2018   Lab Results  Component Value Date   TRIG 149 09/16/2018   TRIG 150 (H) 06/29/2018   TRIG 351 (H) 02/17/2018   Lab Results  Component Value Date   CHOLHDL 3.8 09/16/2018   CHOLHDL 3.2 06/29/2018   CHOLHDL 4.4 02/17/2018   No results found for: LDLDIRECT  Treated for depression which is long-standing, taking Risperdal with control   She  had several surgeries because of biliary duct obstruction She has been followed at [facility] Alkaline  phosphatase variable as follows:   Lab Results  Component Value Date   ALKPHOS 243 (H) 09/16/2018       Examination:   BP 112/80 (BP Location: Left Arm, Patient Position: Sitting, Cuff Size: Normal)   Pulse 67   Ht 5\' 4"  (1.626 m)   Wt  172 lb (78 kg)   SpO2 97%   BMI 29.52 kg/m   Body mass index is 29.52 kg/m.    Assesment/PLAN:  Diabetes type 2, Mild and with Consistently good control    Her A1c is 5.4 and tends to be falsely low  She is done with her lifestyle changes, keeping her weight down and now going for exercise at the gym She is having long-term good control with metformin alone and will continue   HYPERLIPIDEMIA: Her triglycerides are improved and she is able to get Lovaza now from her insurance To reduce the multiple medication she is taking will try to eliminate fenofibrate as this had not helped her as much in the past  She will follow-up with [facility] regarding her persistently abnormal alkaline phosphatase  Follow-up in 6 months with fasting labs   Reather LittlerAjay Jovanka Westgate 09/21/2018, 8:40 AM

## 2018-09-23 ENCOUNTER — Other Ambulatory Visit: Payer: Self-pay | Admitting: Endocrinology

## 2018-11-23 ENCOUNTER — Other Ambulatory Visit: Payer: Self-pay | Admitting: Endocrinology

## 2019-01-22 ENCOUNTER — Other Ambulatory Visit: Payer: Self-pay | Admitting: Endocrinology

## 2019-02-21 ENCOUNTER — Other Ambulatory Visit: Payer: Self-pay | Admitting: Endocrinology

## 2019-03-13 LAB — COMPREHENSIVE METABOLIC PANEL
ALT: 14 IU/L (ref 0–32)
AST: 28 IU/L (ref 0–40)
Albumin/Globulin Ratio: 1.8 (ref 1.2–2.2)
Albumin: 4.4 g/dL (ref 3.8–4.8)
Alkaline Phosphatase: 207 IU/L — ABNORMAL HIGH (ref 39–117)
BUN/Creatinine Ratio: 12 (ref 12–28)
BUN: 13 mg/dL (ref 8–27)
Bilirubin Total: 0.4 mg/dL (ref 0.0–1.2)
CO2: 25 mmol/L (ref 20–29)
Calcium: 9.7 mg/dL (ref 8.7–10.3)
Chloride: 107 mmol/L — ABNORMAL HIGH (ref 96–106)
Creatinine, Ser: 1.1 mg/dL — ABNORMAL HIGH (ref 0.57–1.00)
GFR calc Af Amer: 61 mL/min/{1.73_m2} (ref >59)
GFR, EST NON AFRICAN AMERICAN: 53 mL/min/{1.73_m2} — AB (ref >59)
Globulin, Total: 2.5 g/dL (ref 1.5–4.5)
Glucose: 108 mg/dL — ABNORMAL HIGH (ref 65–99)
Potassium: 4.3 mmol/L (ref 3.5–5.2)
SODIUM: 143 mmol/L (ref 134–144)
Total Protein: 6.9 g/dL (ref 6.0–8.5)

## 2019-03-13 LAB — LIPID PANEL
CHOLESTEROL TOTAL: 108 mg/dL (ref 100–199)
Chol/HDL Ratio: 3 ratio (ref 0.0–4.4)
HDL: 36 mg/dL — ABNORMAL LOW (ref >39)
LDL CALC: 45 mg/dL (ref 0–99)
TRIGLYCERIDES: 135 mg/dL (ref 0–149)
VLDL CHOLESTEROL CAL: 27 mg/dL (ref 5–40)

## 2019-03-13 LAB — MICROALBUMIN / CREATININE URINE RATIO
Creatinine, Urine: 35.8 mg/dL
Microalb/Creat Ratio: 19 mg/g creat (ref 0–29)
Microalbumin, Urine: 6.7 ug/mL

## 2019-03-13 LAB — HEMOGLOBIN A1C
Est. average glucose Bld gHb Est-mCnc: 108 mg/dL
Hgb A1c MFr Bld: 5.4 % (ref 4.8–5.6)

## 2019-03-23 ENCOUNTER — Encounter: Payer: Self-pay | Admitting: Endocrinology

## 2019-03-23 ENCOUNTER — Ambulatory Visit: Payer: Medicare Other | Admitting: Endocrinology

## 2019-03-23 ENCOUNTER — Other Ambulatory Visit: Payer: Self-pay

## 2019-03-23 ENCOUNTER — Other Ambulatory Visit: Payer: Self-pay | Admitting: Endocrinology

## 2019-03-23 ENCOUNTER — Ambulatory Visit (INDEPENDENT_AMBULATORY_CARE_PROVIDER_SITE_OTHER): Payer: Medicare Other | Admitting: Endocrinology

## 2019-03-23 DIAGNOSIS — E782 Mixed hyperlipidemia: Secondary | ICD-10-CM

## 2019-03-23 DIAGNOSIS — E119 Type 2 diabetes mellitus without complications: Secondary | ICD-10-CM | POA: Diagnosis not present

## 2019-03-23 NOTE — Progress Notes (Signed)
Patient ID: Kristin Olson, female   DOB: [birth year], 63 y.o.   MRN: [id number]   Today's office visit was provided via telemedicine using video technique Explained to the patient and the the limitations of evaluation and management by telemedicine and the availability of in person appointments.  The patient understood the limitations and agreed to proceed. Patient also understood that the telehealth visit is billable. . Location of the patient: Home . Location of the provider: Office Only the patient and myself were participating in the encounter    Reason for Appointment: Endocrinology follow-up   History of Present Illness   Diagnosis: Type 2 DIABETES MELITUS, date of diagnosis: 1999      She has had mild diabetes for several years which has been usually well controlled with metformin alone, currently taking 1 g twice a day. .  A1c is   again lower than expected for her blood sugars, as before 5.4  Current blood sugars, problems identified and self management:  As before she checks her blood sugars only very rarely as she does not like to do this  She is asking about the freestyle libre sensor for checking sugars  Although her glucose was only 108 fasting in the lab last month she had a reading of 129 fasting  2 weeks ago her sugar after lunch was 154  She has had decreased appetite and is eating mostly 1 meal a day with some small snacks  Usually not drinking on a regular soft drinks also  She appears to have lost at least 10 pounds since her last visit  She is not as active as before  Exercise regimen: She is trying to go to do some toning and stretching exercises at home  Monitors blood glucose:  infrequently     Glucometer: One Touch.    Blood sugars as above   Oral hypoglycemic drugs: Metformin 1 g twice a day        Side effects from medications: None            Wt Readings from Last 3 Encounters:  09/21/18 172 lb (78 kg)  03/04/18 173 lb 3.2 oz  (78.6 kg)  11/04/17 183 lb (83 kg)     Lab Results  Component Value Date   HGBA1C 5.4 03/12/2019   HGBA1C 5.4 09/16/2018   HGBA1C 5.2 06/29/2018   Lab Results  Component Value Date   MICROALBUR 2.4 (H) 03/04/2018   LDLCALC 45 03/12/2019   CREATININE 1.10 (H) 03/12/2019      PRIOR history:  Because of somewhat higher blood sugars and weight gain she was tried on Byetta in 2006 and subsequently Victoza. Previously Victoza had helped her with portion control and weight loss.    However because she had difficulty with nausea when she was taking this it was stopped, also  could not afford this because of insurance noncoverage; she stopped taking this in 2014    OTHER active problems: See review of systems     Allergies as of 03/23/2019      Reactions   Levaquin [levofloxacin In D5w] Itching      Medication List       Accurate as of March 23, 2019  9:26 AM. If you have any questions, ask your nurse or doctor.        atorvastatin 10 MG tablet Commonly known as:  LIPITOR Take 1 tablet by mouth daily.   cholestyramine light 4 g packet Commonly known as:  PREVALITE  diazepam 5 MG tablet Commonly known as:  VALIUM Take 5 mg by mouth 3 (three) times daily.   fenofibrate 145 MG tablet Commonly known as:  Tricor Take 1 tablet (145 mg total) by mouth daily.   gabapentin 400 MG capsule Commonly known as:  NEURONTIN Take 400 mg by mouth 2 (two) times daily.   glucose blood test strip Commonly known as:  ONE TOUCH ULTRA TEST Use as instructed to check blood sugar 2 times per day dx code E11.65   metFORMIN 1000 MG tablet Commonly known as:  GLUCOPHAGE Take 1 tablet by mouth twice daily.   mirtazapine 7.5 MG tablet Commonly known as:  REMERON Take 2 mg by mouth at bedtime.   niacin 750 MG CR tablet Commonly known as:  NIASPAN Take 1 tablet (750 mg total) at bedtime by mouth.   omega-3 acid ethyl esters 1 g capsule Commonly known as:  LOVAZA Take 2 capsules by  mouth twice daily.   omeprazole 20 MG capsule Commonly known as:  PRILOSEC 20 mg.   OneTouch Delica Lancets Fine Misc Use twice a day dx code E11.65   oxybutynin 15 MG 24 hr tablet Commonly known as:  DITROPAN XL Take 15 mg by mouth 2 (two) times daily.   risperiDONE 2 MG tablet Commonly known as:  RISPERDAL Take 2 mg by mouth once.   sertraline 100 MG tablet Commonly known as:  ZOLOFT Take 100 mg by mouth daily. 2 tablets by mouth daily   SUMAtriptan 100 MG tablet Commonly known as:  IMITREX Take 100 mg by mouth every 4 (four) hours as needed for migraine.   trimethoprim 100 MG tablet Commonly known as:  TRIMPEX Take 100 mg by mouth 2 (two) times daily.       History reviewed. No pertinent past medical history.  History reviewed. No pertinent surgical history.  Family History  Problem Relation Age of Onset  . Diabetes Mother   . Diabetes Maternal Grandfather     Social History:  reports that she has never smoked. She has never used smokeless tobacco. No history on file for alcohol and drug.  Allergies:  Allergies  Allergen Reactions  . Levaquin [Levofloxacin In D5w] Itching     Review of systems:   She is still taking cholestyramine, 2 scoops a day for her diarrhea from gastroenterologist  HYPERLIPIDEMIA: She has had high LDL, high triglycerides as also LDL particle number  and low HDL.   Previously her particle number was excellent at 555  She is taking Lipitor 10 mg daily and her LDL levels are relatively low  Triglycerides are controlled better with using Lovaza and Niaspan and fenofibrate has been stopped  Recently also her HDL appears better Her triglycerides are still excellent especially with her cutting back on overall food intake   Lab Results  Component Value Date   CHOL 108 03/12/2019   CHOL 119 09/16/2018   CHOL 105 06/29/2018   Lab Results  Component Value Date   HDL 36 (L) 03/12/2019   HDL 31 (L) 09/16/2018   HDL 33 (L)  06/29/2018   Lab Results  Component Value Date   LDLCALC 45 03/12/2019   LDLCALC 58 09/16/2018   LDLCALC 42 06/29/2018   Lab Results  Component Value Date   TRIG 135 03/12/2019   TRIG 149 09/16/2018   TRIG 150 (H) 06/29/2018   Lab Results  Component Value Date   CHOLHDL 3.0 03/12/2019   CHOLHDL 3.8 09/16/2018   CHOLHDL 3.2  06/29/2018   No results found for: LDLDIRECT  Treated for depression which is long-standing, taking Risperdal 2 mg   She  had several 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  There is no height or weight on file to calculate BMI.    Assesment/PLAN:  Diabetes type 2, mild and treated with metformin only   Her A1c is again 5.4 and tends to be falsely low She has only blood sugar monitoring done twice at home and not clear if her test trips or expired  Recently however she is losing weight because of decreased appetite of unclear etiology  Currently her fasting lab glucose was 108 and nonfasting glucose at home 154 recently She is having long-term good control with metformin without any side effects Since she has still blood sugars above the normal range she has been continue taking metformin  WEIGHT loss: She was advised to set up an appointment with her PCP to evaluate this  HYPERLIPIDEMIA: Her triglycerides are normal and she will continue Lovaza and Niaspan which is also improving her HDL   She will follow-up with [facility] regarding her persistently abnormal alkaline phosphatase  Follow-up in 6 months with fasting labs   Reather LittlerAjay Icela Glymph 03/23/2019, 9:26 AM

## 2019-03-25 ENCOUNTER — Other Ambulatory Visit: Payer: Self-pay

## 2019-03-25 MED ORDER — ONETOUCH DELICA LANCETS 30G MISC: 1.0000 | Freq: Two times a day (BID) | 0 refills | Status: DC

## 2019-03-29 ENCOUNTER — Other Ambulatory Visit: Payer: Self-pay

## 2019-03-29 MED ORDER — ONETOUCH DELICA LANCETS 30G MISC: 1.0000 | Freq: Two times a day (BID) | 0 refills | Status: DC

## 2019-04-07 ENCOUNTER — Telehealth: Payer: Self-pay | Admitting: Endocrinology

## 2019-04-07 NOTE — Telephone Encounter (Signed)
[facility] stated to refax the document with the correct instructions for the patient not testing 4 times a day.  Please Advise, Thanks

## 2019-04-22 ENCOUNTER — Other Ambulatory Visit: Payer: Self-pay | Admitting: Endocrinology

## 2019-05-18 ENCOUNTER — Other Ambulatory Visit: Payer: Self-pay | Admitting: Endocrinology

## 2019-06-21 ENCOUNTER — Other Ambulatory Visit: Payer: Self-pay | Admitting: Endocrinology

## 2019-07-21 ENCOUNTER — Other Ambulatory Visit: Payer: Self-pay | Admitting: Endocrinology

## 2019-08-03 LAB — HM DIABETES EYE EXAM

## 2019-08-18 ENCOUNTER — Other Ambulatory Visit: Payer: Self-pay | Admitting: Endocrinology

## 2019-09-01 ENCOUNTER — Telehealth: Payer: Self-pay | Admitting: Endocrinology

## 2019-09-01 ENCOUNTER — Other Ambulatory Visit: Payer: Self-pay

## 2019-09-01 DIAGNOSIS — E119 Type 2 diabetes mellitus without complications: Secondary | ICD-10-CM

## 2019-09-01 DIAGNOSIS — E782 Mixed hyperlipidemia: Secondary | ICD-10-CM

## 2019-09-01 NOTE — Addendum Note (Signed)
Addended by: Kaylyn Lim I on: 09/01/2019 11:33 AM   Modules accepted: Orders

## 2019-09-01 NOTE — Telephone Encounter (Signed)
Labs will be faxed.

## 2019-09-01 NOTE — Telephone Encounter (Signed)
Patient requests to be called at ph# [phone] re: status of Lab Order to be sent to [facility] in [place] prior to patient's virtual appointment on 09/22/19. Patient needs to have labs drawn next week (week of 09/06/19) due to patient will be out of town the week of 09/13/19.

## 2019-09-01 NOTE — Addendum Note (Signed)
Addended by: Kaylyn Lim I on: 09/01/2019 11:34 AM   Modules accepted: Orders

## 2019-09-01 NOTE — Addendum Note (Signed)
Addended by: STONE-ELMORE, Sanav Remer I on: 09/01/2019 11:33 AM   Modules accepted: Orders  

## 2019-09-07 LAB — LIPID PANEL
Chol/HDL Ratio: 3.2 ratio (ref 0.0–4.4)
Cholesterol, Total: 102 mg/dL (ref 100–199)
HDL: 32 mg/dL — ABNORMAL LOW (ref >39)
LDL Chol Calc (NIH): 46 mg/dL (ref 0–99)
Triglycerides: 138 mg/dL (ref 0–149)
VLDL Cholesterol Cal: 24 mg/dL (ref 5–40)

## 2019-09-07 LAB — HEMOGLOBIN A1C
Est. average glucose Bld gHb Est-mCnc: 105 mg/dL
Hgb A1c MFr Bld: 5.3 % (ref 4.8–5.6)

## 2019-09-07 LAB — COMPREHENSIVE METABOLIC PANEL
ALT: 14 IU/L (ref 0–32)
AST: 26 IU/L (ref 0–40)
Albumin/Globulin Ratio: 1.3 (ref 1.2–2.2)
Albumin: 4 g/dL (ref 3.8–4.8)
Alkaline Phosphatase: 195 IU/L — ABNORMAL HIGH (ref 39–117)
BUN/Creatinine Ratio: 12 (ref 12–28)
BUN: 16 mg/dL (ref 8–27)
Bilirubin Total: 0.3 mg/dL (ref 0.0–1.2)
CO2: 18 mmol/L — ABNORMAL LOW (ref 20–29)
Calcium: 9.3 mg/dL (ref 8.7–10.3)
Chloride: 108 mmol/L — ABNORMAL HIGH (ref 96–106)
Creatinine, Ser: 1.34 mg/dL — ABNORMAL HIGH (ref 0.57–1.00)
GFR calc Af Amer: 48 mL/min/{1.73_m2} — ABNORMAL LOW (ref >59)
GFR calc non Af Amer: 42 mL/min/{1.73_m2} — ABNORMAL LOW (ref >59)
Globulin, Total: 3 g/dL (ref 1.5–4.5)
Glucose: 100 mg/dL — ABNORMAL HIGH (ref 65–99)
Potassium: 4.1 mmol/L (ref 3.5–5.2)
Sodium: 145 mmol/L — ABNORMAL HIGH (ref 134–144)
Total Protein: 7 g/dL (ref 6.0–8.5)

## 2019-09-22 ENCOUNTER — Other Ambulatory Visit: Payer: Self-pay

## 2019-09-22 ENCOUNTER — Ambulatory Visit (INDEPENDENT_AMBULATORY_CARE_PROVIDER_SITE_OTHER): Payer: Medicare Other | Admitting: Endocrinology

## 2019-09-22 ENCOUNTER — Encounter: Payer: Self-pay | Admitting: Endocrinology

## 2019-09-22 DIAGNOSIS — E119 Type 2 diabetes mellitus without complications: Secondary | ICD-10-CM | POA: Diagnosis not present

## 2019-09-22 DIAGNOSIS — E782 Mixed hyperlipidemia: Secondary | ICD-10-CM

## 2019-09-22 NOTE — Progress Notes (Signed)
Patient ID: Halina MaidensSheila Babinski, female   DOB: 07-14-1956, 63 y.o.   MRN: [id number]   Today's office visit was provided via telemedicine using video technique Explained to the patient and the the limitations of evaluation and management by telemedicine and the availability of in person appointments.  The patient understood the limitations and agreed to proceed. Patient also understood that the telehealth visit is billable.  Location of the patient: Home  Location of the provider: Office Only the patient and myself were participating in the encounter    Reason for Appointment: Endocrinology follow-up   History of Present Illness   Diagnosis: Type 2 DIABETES MELITUS, date of diagnosis: 1999      She has had mild diabetes for several years which has been usually well controlled with metformin alone, taking 1 g twice a day. .  A1c is   consistently lower than expected for her blood sugars and now 5.3  Current blood sugars, problems identified and self management:  She has been losing weight this year  She now says that she has had decreased appetite and nausea and not clear what the reason is  Previously had tolerated Metformin well without side effects for several years  Her diet has been small in portions but not daily balanced, has been told to cut back on regular soft drinks  Her highest blood sugar at home is 141 in the early evening, not clear if this was after dinner  Although the lab fasting glucose is 100 she has not checked morning readings at home  She is not able to do any exercise because of hip pain, previously also going to the gym  Exercise regimen: She is trying to go to do some toning and stretching exercises at home  Monitors blood glucose:  infrequently     Glucometer: One Touch.     Blood sugars at home range from 111-141 only 5 readings available, mostly midday   Oral hypoglycemic drugs: Metformin 1 g twice a day        Side effects from  medications: None            Wt Readings from Last 3 Encounters:  09/21/18 172 lb (78 kg)  03/04/18 173 lb 3.2 oz (78.6 kg)  11/04/17 183 lb (83 kg)     Lab Results  Component Value Date   HGBA1C 5.3 09/06/2019   HGBA1C 5.4 03/12/2019   HGBA1C 5.4 09/16/2018   Lab Results  Component Value Date   MICROALBUR 2.4 (H) 03/04/2018   LDLCALC 46 09/06/2019   CREATININE 1.34 (H) 09/06/2019      PRIOR history:  Because of somewhat higher blood sugars and weight gain she was tried on Byetta in 2006 and subsequently Victoza. Previously Victoza had helped her with portion control and weight loss.    However because she had difficulty with nausea when she was taking this it was stopped, also  could not afford this because of insurance noncoverage; she stopped taking this in 2014    OTHER active problems: See review of systems     Allergies as of 09/22/2019      Reactions   Levaquin [levofloxacin In D5w] Itching      Medication List       Accurate as of September 22, 2019  8:49 AM. If you have any questions, ask your nurse or doctor.        atorvastatin 10 MG tablet Commonly known as: LIPITOR Take 1 tablet by mouth daily.  cholestyramine light 4 g packet Commonly known as: PREVALITE Take 4 g by mouth 3 (three) times daily.   diazepam 5 MG tablet Commonly known as: VALIUM Take 5 mg by mouth 3 (three) times daily.   gabapentin 400 MG capsule Commonly known as: NEURONTIN Take 400 mg by mouth 2 (two) times daily.   glucose blood test strip Commonly known as: ONE TOUCH ULTRA TEST Use as instructed to check blood sugar 2 times per day dx code E11.65   metFORMIN 1000 MG tablet Commonly known as: GLUCOPHAGE Take 1 tablet by mouth twice daily.   mirtazapine 7.5 MG tablet Commonly known as: REMERON Take 2 mg by mouth at bedtime.   niacin 750 MG CR tablet Commonly known as: NIASPAN Take 1 tablet (750 mg total) at bedtime by mouth.   omega-3 acid ethyl esters 1 g  capsule Commonly known as: LOVAZA Take 2 capsules by mouth twice daily.   omeprazole 20 MG capsule Commonly known as: PRILOSEC 20 mg.   OneTouch Delica Lancets 30G Misc 1 each by Other route 2 (two) times a day. Use as instructed to check blood sugar twice daily.DX:E11.9   oxybutynin 15 MG 24 hr tablet Commonly known as: DITROPAN XL Take 15 mg by mouth 2 (two) times daily.   risperiDONE 2 MG tablet Commonly known as: RISPERDAL Take 2 mg by mouth once.   sertraline 100 MG tablet Commonly known as: ZOLOFT Take 100 mg by mouth daily. 2 tablets by mouth daily   SUMAtriptan 100 MG tablet Commonly known as: IMITREX Take 100 mg by mouth every 4 (four) hours as needed for migraine.   trimethoprim 100 MG tablet Commonly known as: TRIMPEX Take 100 mg by mouth 2 (two) times daily.       History reviewed. No pertinent past medical history.  History reviewed. No pertinent surgical history.  Family History  Problem Relation Age of Onset   Diabetes Mother    Diabetes Maternal Grandfather     Social History:  reports that she has never smoked. She has never used smokeless tobacco. No history on file for alcohol and drug.  Allergies:  Allergies  Allergen Reactions   Levaquin [Levofloxacin In D5w] Itching     Review of systems:  She takes tramadol for pain, not on any nonsteroidal drugs Not clear why her creatinine is higher although she has had recurrent UTI  Lab Results  Component Value Date   CREATININE 1.34 (H) 09/06/2019   CREATININE 1.10 (H) 03/12/2019   CREATININE 1.13 (H) 09/16/2018    She is still taking cholestyramine, 2 scoops a day for her diarrhea from gastroenterologist  HYPERLIPIDEMIA: She has had high LDL, high triglycerides as also LDL particle number  and low HDL.   Previously her particle number was excellent at 555  She is taking Lipitor 10 mg daily and her LDL levels are below 70  Triglycerides are controlled with using Lovaza and  Niaspan Fenofibrate has been stopped  Still has low HDL Her triglycerides are still below 150   Lab Results  Component Value Date   CHOL 102 09/06/2019   CHOL 108 03/12/2019   CHOL 119 09/16/2018   Lab Results  Component Value Date   HDL 32 (L) 09/06/2019   HDL 36 (L) 03/12/2019   HDL 31 (L) 09/16/2018   Lab Results  Component Value Date   LDLCALC 46 09/06/2019   LDLCALC 45 03/12/2019   LDLCALC 58 09/16/2018   Lab Results  Component Value Date  TRIG 138 09/06/2019   TRIG 135 03/12/2019   TRIG 149 09/16/2018   Lab Results  Component Value Date   CHOLHDL 3.2 09/06/2019   CHOLHDL 3.0 03/12/2019   CHOLHDL 3.8 09/16/2018   No results found for: LDLDIRECT  DEPRESSION: Controlled, long-standing, taking Risperdal 2 mg and Remeron  GI: 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  There is no height or weight on file to calculate BMI.    Assesment/PLAN:  Diabetes type 2, mild and treated with metformin only  Her A1c is falsely low at 5.3  Fasting glucose 100 in the lab and home readings are mostly in the low 100 range although not doing enough readings after meals  She continues to have some nausea and decreased appetite with some weight loss of unclear etiology  Since she is continuing to have some GI problems she can stop taking Metformin in the morning and only take it after dinner  Mild increase in creatinine: She will follow up with her PCP She will avoid any Advil or Aleve OTC  HYPERLIPIDEMIA: Her triglycerides are normal with Lovaza and Lipitor Because of her GI symptoms will hold off on her Niaspan for now and recheck in 3 months     Curtistine Pettitt 09/22/2019, 8:49 AM

## 2019-09-23 ENCOUNTER — Other Ambulatory Visit: Payer: Self-pay | Admitting: Endocrinology

## 2019-10-15 ENCOUNTER — Other Ambulatory Visit: Payer: Self-pay | Admitting: Endocrinology

## 2019-11-11 LAB — HM DIABETES EYE EXAM

## 2019-11-17 ENCOUNTER — Other Ambulatory Visit: Payer: Self-pay | Admitting: Endocrinology

## 2019-12-06 ENCOUNTER — Other Ambulatory Visit: Payer: Self-pay

## 2019-12-06 DIAGNOSIS — E782 Mixed hyperlipidemia: Secondary | ICD-10-CM

## 2019-12-06 DIAGNOSIS — E119 Type 2 diabetes mellitus without complications: Secondary | ICD-10-CM

## 2019-12-08 LAB — COMPREHENSIVE METABOLIC PANEL
ALT: 21 IU/L (ref 0–32)
AST: 31 IU/L (ref 0–40)
Albumin/Globulin Ratio: 1.5 (ref 1.2–2.2)
Albumin: 4.3 g/dL (ref 3.8–4.8)
Alkaline Phosphatase: 251 IU/L — ABNORMAL HIGH (ref 39–117)
BUN/Creatinine Ratio: 10 — ABNORMAL LOW (ref 12–28)
BUN: 16 mg/dL (ref 8–27)
Bilirubin Total: 0.3 mg/dL (ref 0.0–1.2)
CO2: 26 mmol/L (ref 20–29)
Calcium: 9.5 mg/dL (ref 8.7–10.3)
Chloride: 107 mmol/L — ABNORMAL HIGH (ref 96–106)
Creatinine, Ser: 1.54 mg/dL — ABNORMAL HIGH (ref 0.57–1.00)
GFR calc Af Amer: 41 mL/min/{1.73_m2} — ABNORMAL LOW (ref >59)
GFR calc non Af Amer: 35 mL/min/{1.73_m2} — ABNORMAL LOW (ref >59)
Globulin, Total: 2.9 g/dL (ref 1.5–4.5)
Glucose: 106 mg/dL — ABNORMAL HIGH (ref 65–99)
Potassium: 5 mmol/L (ref 3.5–5.2)
Sodium: 147 mmol/L — ABNORMAL HIGH (ref 134–144)
Total Protein: 7.2 g/dL (ref 6.0–8.5)

## 2019-12-08 LAB — LIPID PANEL
Chol/HDL Ratio: 3.3 ratio (ref 0.0–4.4)
Cholesterol, Total: 118 mg/dL (ref 100–199)
HDL: 36 mg/dL — ABNORMAL LOW (ref >39)
LDL Chol Calc (NIH): 62 mg/dL (ref 0–99)
Triglycerides: 107 mg/dL (ref 0–149)
VLDL Cholesterol Cal: 20 mg/dL (ref 5–40)

## 2019-12-08 LAB — HEMOGLOBIN A1C
Est. average glucose Bld gHb Est-mCnc: 117 mg/dL
Hgb A1c MFr Bld: 5.7 % — ABNORMAL HIGH (ref 4.8–5.6)

## 2019-12-14 ENCOUNTER — Other Ambulatory Visit: Payer: Self-pay | Admitting: Endocrinology

## 2019-12-23 ENCOUNTER — Ambulatory Visit: Payer: Medicare Other | Admitting: Endocrinology

## 2019-12-27 ENCOUNTER — Other Ambulatory Visit: Payer: Self-pay

## 2019-12-27 NOTE — Progress Notes (Signed)
Patient ID: Kristin Olson, female   DOB: [birth year], 64 y.o.   MRN: [id number]   Today's office visit was provided via telemedicine using video technique Explained to the patient and the the limitations of evaluation and management by telemedicine and the availability of in person appointments.  The patient understood the limitations and agreed to proceed. Patient also understood that the telehealth visit is billable. . Location of the patient: Home . Location of the provider: Office Only the patient and myself were participating in the encounter    Reason for Appointment: Endocrinology follow-up   History of Present Illness   Diagnosis: Type 2 DIABETES MELITUS, date of diagnosis: 1999      She has had mild diabetes for several years which has been usually well controlled with metformin alone  Current oral hypoglycemic drugs: Metformin 1 g at dinnertime  A1c is lower than expected for her blood sugars, recently 5.7 compared to 5.3  Current blood sugars, problems identified and self management:  She has been still having difficulties with decreased appetite and nausea and because of this her Metformin was reduced on her last visit  She may be still losing weight this year  Check blood sugar somewhat irregularly and still appear to be relatively high fasting  However he did have a blood sugar of 180 while getting preparation for her colonoscopy and likely to be from what she was drinking  Lowest blood sugar is only 115 at home but was 106 in the lab done late in the day fasting  She has difficulty doing much walking because of hip pain and other debilities  Most recent weight is 167 pounds  Exercise regimen: She is trying to go to do some toning and stretching exercises at home  Monitors blood glucose:  infrequently     Glucometer: One Touch.     Blood sugars at home range from 115 up to 180 with most readings around 120   Side effects from medications: None             Wt Readings from Last 3 Encounters:  09/21/18 172 lb (78 kg)  03/04/18 173 lb 3.2 oz (78.6 kg)  11/04/17 183 lb (83 kg)     Lab Results  Component Value Date   HGBA1C 5.7 (H) 12/07/2019   HGBA1C 5.3 09/06/2019   HGBA1C 5.4 03/12/2019   Lab Results  Component Value Date   MICROALBUR 2.4 (H) 03/04/2018   LDLCALC 62 12/07/2019   CREATININE 1.54 (H) 12/07/2019   PRIOR history:  Because of somewhat higher blood sugars and weight gain she was tried on Byetta in 2006 and subsequently Victoza. Previously Victoza had helped her with portion control and weight loss.    However because she had difficulty with nausea when she was taking this it was stopped, also  could not afford this because of insurance noncoverage; she stopped taking this in 2014    OTHER active problems: See review of systems     Allergies as of 12/28/2019      Reactions   Levaquin [levofloxacin In D5w] Itching      Medication List       Accurate as of December 27, 2019  9:00 PM. If you have any questions, ask your nurse or doctor.        atorvastatin 10 MG tablet Commonly known as: LIPITOR Take 1 tablet by mouth daily.   cholestyramine light 4 g packet Commonly known as: PREVALITE Take 4 g by mouth  3 (three) times daily.   diazepam 5 MG tablet Commonly known as: VALIUM Take 5 mg by mouth 3 (three) times daily.   gabapentin 400 MG capsule Commonly known as: NEURONTIN Take 400 mg by mouth 2 (two) times daily.   glucose blood test strip Commonly known as: ONE TOUCH ULTRA TEST Use as instructed to check blood sugar 2 times per day dx code E11.65   metFORMIN 1000 MG tablet Commonly known as: GLUCOPHAGE Take 1 tablet by mouth twice daily.   mirtazapine 7.5 MG tablet Commonly known as: REMERON Take 2 mg by mouth at bedtime.   niacin 750 MG CR tablet Commonly known as: NIASPAN Take 1 tablet (750 mg total) at bedtime by mouth.   omega-3 acid ethyl esters 1 g capsule Commonly known as:  LOVAZA Take 2 capsules by mouth twice daily.   omeprazole 20 MG capsule Commonly known as: PRILOSEC 20 mg.   OneTouch Delica Lancets 63K Misc 1 each by Other route 2 (two) times a day. Use as instructed to check blood sugar twice daily.DX:E11.9   oxybutynin 15 MG 24 hr tablet Commonly known as: DITROPAN XL Take 15 mg by mouth 2 (two) times daily.   risperiDONE 2 MG tablet Commonly known as: RISPERDAL Take 2 mg by mouth once.   sertraline 100 MG tablet Commonly known as: ZOLOFT Take 100 mg by mouth daily. 2 tablets by mouth daily   SUMAtriptan 100 MG tablet Commonly known as: IMITREX Take 100 mg by mouth every 4 (four) hours as needed for migraine.   trimethoprim 100 MG tablet Commonly known as: TRIMPEX Take 100 mg by mouth 2 (two) times daily.       No past medical history on file.  No past surgical history on file.  Family History  Problem Relation Age of Onset  . Diabetes Mother   . Diabetes Maternal Grandfather     Social History:  reports that she has never smoked. She has never used smokeless tobacco. No history on file for alcohol and drug.  Allergies:  Allergies  Allergen Reactions  . Levaquin [Levofloxacin In D5w] Itching     Review of systems:  RENAL dysfunction: Not clear why her creatinine is higher although she has had recurrent UTI Does not take any Advil or Aleve, is apparently due to see a urologist  Lab Results  Component Value Date   CREATININE 1.54 (H) 12/07/2019   CREATININE 1.34 (H) 09/06/2019   CREATININE 1.10 (H) 03/12/2019    She is still taking cholestyramine, 2 scoops a day for her diarrhea from gastroenterologist  HYPERLIPIDEMIA: She has had high LDL, high triglycerides as also LDL particle number  and low HDL.   Previously her particle number was excellent at 555  She is taking Lipitor 10 mg daily and her LDL levels are below 70  Triglycerides are controlled with using Lovaza Niaspan and fenofibrate have been stopped   Still has low HDL Her triglycerides are still below 150   Lab Results  Component Value Date   CHOL 118 12/07/2019   CHOL 102 09/06/2019   CHOL 108 03/12/2019   Lab Results  Component Value Date   HDL 36 (L) 12/07/2019   HDL 32 (L) 09/06/2019   HDL 36 (L) 03/12/2019   Lab Results  Component Value Date   LDLCALC 62 12/07/2019   LDLCALC 46 09/06/2019   LDLCALC 45 03/12/2019   Lab Results  Component Value Date   TRIG 107 12/07/2019   TRIG 138 09/06/2019  TRIG 135 03/12/2019   Lab Results  Component Value Date   CHOLHDL 3.3 12/07/2019   CHOLHDL 3.2 09/06/2019   CHOLHDL 3.0 03/12/2019   No results found for: LDLDIRECT  DEPRESSION: Controlled, long-standing, taking Risperdal 2 mg and Remeron  GI: She  had several surgeries because of biliary duct obstruction She has been followed at [facility] Alkaline phosphatase persistently high   Lab Results  Component Value Date   ALKPHOS 251 (H) 12/07/2019    She is now having issues with malignant polyps of the colon    Examination:   There were no vitals taken for this visit.  There is no height or weight on file to calculate BMI.    Assesment/PLAN:  Diabetes type 2, mild and treated with metformin 1 g only  Her A1c is still relatively low at 5.7 although her blood sugars are mostly around 120 or higher Lab fasting glucose done midday was 106 She will continue the same regimen unless blood sugars are starting to get higher especially if she has return of her appetite  Mild increase in creatinine: She will follow up with her PCP and may consider nephrology consultation Not clear if she may have been dehydrated when she had her labs  HYPERLIPIDEMIA: Her triglycerides and LDL are normal with Lovaza and Lipitor and she will continue, fasting labs again in the next visit  Reather Littler 12/27/2019, 9:00 PM

## 2019-12-28 ENCOUNTER — Other Ambulatory Visit: Payer: Self-pay

## 2019-12-28 ENCOUNTER — Encounter: Payer: Self-pay | Admitting: Endocrinology

## 2019-12-28 ENCOUNTER — Ambulatory Visit (INDEPENDENT_AMBULATORY_CARE_PROVIDER_SITE_OTHER): Payer: Medicare Other | Admitting: Endocrinology

## 2019-12-28 DIAGNOSIS — E119 Type 2 diabetes mellitus without complications: Secondary | ICD-10-CM | POA: Diagnosis not present

## 2019-12-28 DIAGNOSIS — E782 Mixed hyperlipidemia: Secondary | ICD-10-CM | POA: Diagnosis not present

## 2020-01-06 ENCOUNTER — Other Ambulatory Visit: Payer: Self-pay

## 2020-01-06 ENCOUNTER — Telehealth: Payer: Self-pay | Admitting: Endocrinology

## 2020-01-06 DIAGNOSIS — E119 Type 2 diabetes mellitus without complications: Secondary | ICD-10-CM

## 2020-01-06 MED ORDER — GLUCOSE BLOOD VI STRP: ORAL_STRIP | 2 refills | Status: DC

## 2020-01-06 MED ORDER — ONETOUCH DELICA LANCETS 30G MISC: 1.0000 | Freq: Two times a day (BID) | 3 refills | Status: DC

## 2020-01-06 NOTE — Telephone Encounter (Signed)
MEDICATION: Test Strips, Lancets  PHARMACY:  [facility] in [place]   IS THIS A 90 DAY SUPPLY :   IS PATIENT OUT OF MEDICATION:   IF NOT; HOW MUCH IS LEFT:   LAST APPOINTMENT DATE: @3 /06/2020  NEXT APPOINTMENT DATE:@Visit  date not found  DO WE HAVE YOUR PERMISSION TO LEAVE A DETAILED MESSAGE:  OTHER COMMENTS:    **Let patient know to contact pharmacy at the end of the day to make sure medication is ready. **  ** Please notify patient to allow 48-72 hours to process**  **Encourage patient to contact the pharmacy for refills or they can request refills through [facility]**

## 2020-01-06 NOTE — Telephone Encounter (Signed)
Refills sent in to [facility] in [place].

## 2020-01-19 DIAGNOSIS — S72012A Unspecified intracapsular fracture of left femur, initial encounter for closed fracture: Secondary | ICD-10-CM

## 2020-01-19 DIAGNOSIS — E119 Type 2 diabetes mellitus without complications: Secondary | ICD-10-CM

## 2020-01-19 DIAGNOSIS — S52102A Unspecified fracture of upper end of left radius, initial encounter for closed fracture: Secondary | ICD-10-CM

## 2020-01-20 DIAGNOSIS — S72012A Unspecified intracapsular fracture of left femur, initial encounter for closed fracture: Secondary | ICD-10-CM | POA: Diagnosis not present

## 2020-01-20 DIAGNOSIS — S52102A Unspecified fracture of upper end of left radius, initial encounter for closed fracture: Secondary | ICD-10-CM | POA: Diagnosis not present

## 2020-01-20 DIAGNOSIS — E119 Type 2 diabetes mellitus without complications: Secondary | ICD-10-CM | POA: Diagnosis not present

## 2020-01-21 DIAGNOSIS — E119 Type 2 diabetes mellitus without complications: Secondary | ICD-10-CM | POA: Diagnosis not present

## 2020-01-21 DIAGNOSIS — S52102A Unspecified fracture of upper end of left radius, initial encounter for closed fracture: Secondary | ICD-10-CM | POA: Diagnosis not present

## 2020-01-21 DIAGNOSIS — S72012A Unspecified intracapsular fracture of left femur, initial encounter for closed fracture: Secondary | ICD-10-CM | POA: Diagnosis not present

## 2020-01-22 DIAGNOSIS — S52102A Unspecified fracture of upper end of left radius, initial encounter for closed fracture: Secondary | ICD-10-CM | POA: Diagnosis not present

## 2020-01-22 DIAGNOSIS — E119 Type 2 diabetes mellitus without complications: Secondary | ICD-10-CM | POA: Diagnosis not present

## 2020-01-22 DIAGNOSIS — S72012A Unspecified intracapsular fracture of left femur, initial encounter for closed fracture: Secondary | ICD-10-CM | POA: Diagnosis not present

## 2020-01-23 DIAGNOSIS — S72012A Unspecified intracapsular fracture of left femur, initial encounter for closed fracture: Secondary | ICD-10-CM | POA: Diagnosis not present

## 2020-01-23 DIAGNOSIS — S52102A Unspecified fracture of upper end of left radius, initial encounter for closed fracture: Secondary | ICD-10-CM | POA: Diagnosis not present

## 2020-01-23 DIAGNOSIS — E119 Type 2 diabetes mellitus without complications: Secondary | ICD-10-CM | POA: Diagnosis not present

## 2020-01-24 DIAGNOSIS — S52102A Unspecified fracture of upper end of left radius, initial encounter for closed fracture: Secondary | ICD-10-CM | POA: Diagnosis not present

## 2020-01-24 DIAGNOSIS — E119 Type 2 diabetes mellitus without complications: Secondary | ICD-10-CM | POA: Diagnosis not present

## 2020-01-24 DIAGNOSIS — S72012A Unspecified intracapsular fracture of left femur, initial encounter for closed fracture: Secondary | ICD-10-CM | POA: Diagnosis not present

## 2020-02-16 ENCOUNTER — Other Ambulatory Visit: Payer: Self-pay | Admitting: Endocrinology

## 2020-03-17 ENCOUNTER — Other Ambulatory Visit: Payer: Self-pay | Admitting: Endocrinology

## 2020-03-17 ENCOUNTER — Other Ambulatory Visit: Payer: Self-pay

## 2020-03-17 MED ORDER — OMEGA-3-ACID ETHYL ESTERS 1 G PO CAPS: 2.0000 | ORAL_CAPSULE | Freq: Two times a day (BID) | ORAL | 0 refills | Status: DC

## 2020-04-14 ENCOUNTER — Telehealth: Payer: Self-pay | Admitting: Endocrinology

## 2020-04-14 ENCOUNTER — Other Ambulatory Visit: Payer: Self-pay

## 2020-04-14 DIAGNOSIS — E119 Type 2 diabetes mellitus without complications: Secondary | ICD-10-CM

## 2020-04-14 DIAGNOSIS — E782 Mixed hyperlipidemia: Secondary | ICD-10-CM

## 2020-04-14 NOTE — Telephone Encounter (Signed)
Patient called and requested that labs be sent to the [facility] in [place]. I gave request to [place], who did fax the labs to that office.  A confirmation was received that the fax was successful.  I took patients number at the time of the request in case we had any concerns.    Patient called back at approximately 11:30  To find out why she was not called and told that her labs had been sent and that she could go to the lab.  I attempted to explain that she had not had to wait that her labs were sent.  She wanted to speak with office manager to register a complaint that she was not called and told to go to [facility] this am.

## 2020-04-15 LAB — COMPREHENSIVE METABOLIC PANEL
ALT: 30 IU/L (ref 0–32)
AST: 78 IU/L — ABNORMAL HIGH (ref 0–40)
Albumin/Globulin Ratio: 1 — ABNORMAL LOW (ref 1.2–2.2)
Albumin: 3.3 g/dL — ABNORMAL LOW (ref 3.8–4.8)
Alkaline Phosphatase: 273 IU/L — ABNORMAL HIGH (ref 48–121)
BUN/Creatinine Ratio: 9 — ABNORMAL LOW (ref 12–28)
BUN: 12 mg/dL (ref 8–27)
Bilirubin Total: 0.4 mg/dL (ref 0.0–1.2)
CO2: 26 mmol/L (ref 20–29)
Calcium: 9.4 mg/dL (ref 8.7–10.3)
Chloride: 101 mmol/L (ref 96–106)
Creatinine, Ser: 1.36 mg/dL — ABNORMAL HIGH (ref 0.57–1.00)
GFR calc Af Amer: 47 mL/min/{1.73_m2} — ABNORMAL LOW (ref >59)
GFR calc non Af Amer: 41 mL/min/{1.73_m2} — ABNORMAL LOW (ref >59)
Globulin, Total: 3.3 g/dL (ref 1.5–4.5)
Glucose: 96 mg/dL (ref 65–99)
Potassium: 4.7 mmol/L (ref 3.5–5.2)
Sodium: 141 mmol/L (ref 134–144)
Total Protein: 6.6 g/dL (ref 6.0–8.5)

## 2020-04-15 LAB — HEMOGLOBIN A1C
Est. average glucose Bld gHb Est-mCnc: 103 mg/dL
Hgb A1c MFr Bld: 5.2 % (ref 4.8–5.6)

## 2020-04-15 LAB — LIPID PANEL
Chol/HDL Ratio: 4.7 ratio — ABNORMAL HIGH (ref 0.0–4.4)
Cholesterol, Total: 131 mg/dL (ref 100–199)
HDL: 28 mg/dL — ABNORMAL LOW (ref >39)
LDL Chol Calc (NIH): 72 mg/dL (ref 0–99)
Triglycerides: 183 mg/dL — ABNORMAL HIGH (ref 0–149)
VLDL Cholesterol Cal: 31 mg/dL (ref 5–40)

## 2020-04-28 ENCOUNTER — Other Ambulatory Visit: Payer: Self-pay

## 2020-04-28 ENCOUNTER — Encounter: Payer: Self-pay | Admitting: Endocrinology

## 2020-04-28 ENCOUNTER — Telehealth (INDEPENDENT_AMBULATORY_CARE_PROVIDER_SITE_OTHER): Payer: Medicare Other | Admitting: Endocrinology

## 2020-04-28 DIAGNOSIS — E119 Type 2 diabetes mellitus without complications: Secondary | ICD-10-CM | POA: Diagnosis not present

## 2020-04-28 DIAGNOSIS — E782 Mixed hyperlipidemia: Secondary | ICD-10-CM | POA: Diagnosis not present

## 2020-04-28 DIAGNOSIS — R634 Abnormal weight loss: Secondary | ICD-10-CM | POA: Diagnosis not present

## 2020-04-28 NOTE — Progress Notes (Signed)
Patient ID: Kristin Olson, female   DOB: 1956/04/10, 64 y.o.   MRN: [id number]  I connected with the above-named patient by video enabled telemedicine application and verified that I am speaking with the correct person. The patient was explained the limitations of evaluation and management by telemedicine and the availability of in person appointments.  Patient also understood that there may be a patient responsible charge related to this service . Location of the patient: Patient's home . Location of the provider: Physician office Only the patient and myself were participating in the encounter The patient understood the above statements and agreed to proceed.   Reason for Appointment: Endocrinology follow-up   History of Present Illness   Diagnosis: Type 2 DIABETES MELITUS, date of diagnosis: 1999      She has had mild diabetes for several years which has been usually well controlled with metformin alone  Current oral hypoglycemic drugs: Metformin 1 g at bid  A1c is lower than expected for her blood sugars, now 5.2  Current blood sugars, problems identified and self management:  She has had difficulty with her meter which is an old ultra 2 monitor  Although she thinks her weight is only 160 compared to 167 she had an office visit with a weight of 170  However she thinks she still has decreased appetite  May have lost some weight after her hip surgery in March  Currently taking Metformin twice a day, previously had been told to reduce it to once a day and Usually watches her diet as far as portions and simple sugars  Monitors blood glucose:  infrequently     Glucometer: One Touch.     Blood sugars at home range from 99-160 by recall   Side effects from medications: None            Wt Readings from Last 3 Encounters:  09/21/18 172 lb (78 kg)  03/04/18 173 lb 3.2 oz (78.6 kg)  11/04/17 183 lb (83 kg)     Lab Results  Component Value Date   HGBA1C 5.2  04/14/2020   HGBA1C 5.7 (H) 12/07/2019   HGBA1C 5.3 09/06/2019   Lab Results  Component Value Date   MICROALBUR 2.4 (H) 03/04/2018   LDLCALC 72 04/14/2020   CREATININE 1.36 (H) 04/14/2020   PRIOR history:  Because of somewhat higher blood sugars and weight gain she was tried on Byetta in 2006 and subsequently Victoza. Previously Victoza had helped her with portion control and weight loss.    However because she had difficulty with nausea when she was taking this it was stopped, also  could not afford this because of insurance noncoverage; she stopped taking this in 2014    OTHER active problems: See review of systems     Allergies as of 04/28/2020      Reactions   Levaquin [levofloxacin In D5w] Itching      Medication List       Accurate as of April 28, 2020  8:13 AM. If you have any questions, ask your nurse or doctor.        atorvastatin 10 MG tablet Commonly known as: LIPITOR Take 1 tablet by mouth daily.   cholestyramine light 4 g packet Commonly known as: PREVALITE Take 4 g by mouth 3 (three) times daily.   diazepam 5 MG tablet Commonly known as: VALIUM Take 5 mg by mouth 3 (three) times daily.   gabapentin 400 MG capsule Commonly known as: NEURONTIN Take 400 mg  by mouth 2 (two) times daily.   glucose blood test strip Commonly known as: ONE TOUCH ULTRA TEST Use as instructed to check blood sugar 2 times per day dx code E11.65   metFORMIN 1000 MG tablet Commonly known as: GLUCOPHAGE Take 1 tablet by mouth twice daily.   mirtazapine 7.5 MG tablet Commonly known as: REMERON Take 2 mg by mouth at bedtime.   niacin 750 MG CR tablet Commonly known as: NIASPAN Take 1 tablet (750 mg total) at bedtime by mouth.   omega-3 acid ethyl esters 1 g capsule Commonly known as: LOVAZA Take 2 capsules (2 g total) by mouth 2 (two) times daily.   omeprazole 20 MG capsule Commonly known as: PRILOSEC 40 mg.   OneTouch Delica Lancets 30G Misc 1 each by Other route  2 (two) times daily. Use as instructed to check blood sugar twice daily.DX:E11.9   oxybutynin 15 MG 24 hr tablet Commonly known as: DITROPAN XL Take 15 mg by mouth 2 (two) times daily.   risperiDONE 2 MG tablet Commonly known as: RISPERDAL Take 2 mg by mouth once.   sertraline 100 MG tablet Commonly known as: ZOLOFT Take 100 mg by mouth daily. 2 tablets by mouth daily   SUMAtriptan 100 MG tablet Commonly known as: IMITREX Take 100 mg by mouth every 4 (four) hours as needed for migraine.   trimethoprim 100 MG tablet Commonly known as: TRIMPEX Take 100 mg by mouth 2 (two) times daily.       No past medical history on file.  No past surgical history on file.  Family History  Problem Relation Age of Onset  . Diabetes Mother   . Diabetes Maternal Grandfather     Social History:  reports that she has never smoked. She has never used smokeless tobacco. No history on file for alcohol use and drug use.  Allergies:  Allergies  Allergen Reactions  . Levaquin [Levofloxacin In D5w] Itching     Review of systems:  RENAL dysfunction: Not clear why her creatinine is higher although she has had recurrent UTI Does not take any Advil or Aleve, is apparently due to see a urologist  Lab Results  Component Value Date   CREATININE 1.36 (H) 04/14/2020   CREATININE 1.54 (H) 12/07/2019   CREATININE 1.34 (H) 09/06/2019    She is taking cholestyramine, 2 scoops a day for her diarrhea from gastroenterologist  HYPERLIPIDEMIA: She has had high LDL, high triglycerides as also LDL particle number  and low HDL.   Previously her particle number was excellent at 555  She is taking Lipitor 10 mg daily and her LDL levels are below 70  Triglycerides are slightly higher now, on Lovaza but also on cholestyramine Previously on fenofibrate  Still has low HDL    Lab Results  Component Value Date   CHOL 131 04/14/2020   CHOL 118 12/07/2019   CHOL 102 09/06/2019   Lab Results    Component Value Date   HDL 28 (L) 04/14/2020   HDL 36 (L) 12/07/2019   HDL 32 (L) 09/06/2019   Lab Results  Component Value Date   LDLCALC 72 04/14/2020   LDLCALC 62 12/07/2019   LDLCALC 46 09/06/2019   Lab Results  Component Value Date   TRIG 183 (H) 04/14/2020   TRIG 107 12/07/2019   TRIG 138 09/06/2019   Lab Results  Component Value Date   CHOLHDL 4.7 (H) 04/14/2020   CHOLHDL 3.3 12/07/2019   CHOLHDL 3.2 09/06/2019   No  results found for: LDLDIRECT   Lab Results  Component Value Date   ALT 30 04/14/2020     DEPRESSION: Controlled, long-standing, taking Risperdal 2 mg and Remeron  GI: She  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  There is no height or weight on file to calculate BMI.    Assesment/PLAN:  Diabetes type 2, mild and treated with metformin 1 g twice daily  Her A1c is still relatively low at 5.2 Not able to verify what her blood sugars are and her monitor is not working well Lab glucose was only 96 fasting Since she complains of decreased appetite and nausea and her weight is relatively lower she can reduce her Metformin to only 1 g at dinnertime instead of twice daily  Needs foot exam done, advised her to come into the office for the next visit  Mild increase in creatinine: Improved  Abnormal liver tests: She will follow up with her biliary surgeon  HYPERLIPIDEMIA: Although her triglycerides are slightly higher this is not been consistent and will not start fenofibrate as yet Consider adding this if triglycerides go up consistently over 200 Fasting labs on the next visit also    Reather Littler 04/28/2020, 8:13 AM

## 2020-05-03 ENCOUNTER — Telehealth: Payer: Self-pay | Admitting: Endocrinology

## 2020-05-03 ENCOUNTER — Other Ambulatory Visit: Payer: Self-pay

## 2020-05-03 DIAGNOSIS — E119 Type 2 diabetes mellitus without complications: Secondary | ICD-10-CM

## 2020-05-03 MED ORDER — ONETOUCH VERIO FLEX SYSTEM W/DEVICE KIT: PACK | 0 refills | Status: DC

## 2020-05-03 NOTE — Progress Notes (Signed)
Sent in to [facility]

## 2020-05-03 NOTE — Telephone Encounter (Signed)
Patient said when she was here the other day Dr Lucianne Muss was going to call in a new meter for her to  [facility] [address] [place] - [address] Phone:  [phone]  Fax:  [phone]     And she said they said they have not gotten it. Patient said it was a new version of the one touch meter. She said hers broke. Patient ph# [phone]

## 2020-05-08 ENCOUNTER — Other Ambulatory Visit: Payer: Self-pay

## 2020-05-08 ENCOUNTER — Telehealth: Payer: Self-pay | Admitting: Endocrinology

## 2020-05-08 DIAGNOSIS — E119 Type 2 diabetes mellitus without complications: Secondary | ICD-10-CM

## 2020-05-08 MED ORDER — ONETOUCH DELICA LANCETS 30G MISC: 1.0000 | Freq: Two times a day (BID) | 2 refills | Status: DC

## 2020-05-08 MED ORDER — ONETOUCH VERIO VI STRP: ORAL_STRIP | 2 refills | Status: DC

## 2020-05-08 NOTE — Telephone Encounter (Signed)
Rx sent 

## 2020-05-08 NOTE — Telephone Encounter (Signed)
Medication Refill Request  . Did you call your pharmacy and request this refill first? Yes . If patient has not contacted pharmacy first, instruct them to do so for future refills.  . Remind them that contacting the pharmacy for their refill is the quickest method to get the refill.  . Refill policy also stated that it will take anywhere between 24-72 hours to receive the refill.    Name of medication? ONETOUCH VERIO FLEX Test Strips and Lancets  Is this a 90 day supply? yes  Name and location of pharmacy? [facility] [address] in [place]  . Is the request for diabetes test strips? Yes . If yes, what brand? ONETOUCH VERIO FLEX

## 2020-05-10 ENCOUNTER — Other Ambulatory Visit: Payer: Self-pay

## 2020-05-10 DIAGNOSIS — E119 Type 2 diabetes mellitus without complications: Secondary | ICD-10-CM

## 2020-05-10 MED ORDER — ONETOUCH VERIO FLEX SYSTEM W/DEVICE KIT: PACK | 0 refills | Status: DC

## 2020-06-15 ENCOUNTER — Other Ambulatory Visit: Payer: Self-pay | Admitting: Endocrinology

## 2020-07-07 ENCOUNTER — Telehealth: Payer: Self-pay | Admitting: Endocrinology

## 2020-07-07 NOTE — Telephone Encounter (Signed)
Patient requests to have her Lab Orders faxed to [facility] in [place] on [address] -Patient states fax# should be in chart-patient states she always has her prior labs done there.

## 2020-07-11 ENCOUNTER — Other Ambulatory Visit: Payer: Self-pay | Admitting: Endocrinology

## 2020-07-12 LAB — COMPREHENSIVE METABOLIC PANEL
ALT: 23 IU/L (ref 0–32)
AST: 58 IU/L — ABNORMAL HIGH (ref 0–40)
Albumin/Globulin Ratio: 1.2 (ref 1.2–2.2)
Albumin: 3.7 g/dL — ABNORMAL LOW (ref 3.8–4.8)
Alkaline Phosphatase: 226 IU/L — ABNORMAL HIGH (ref 44–121)
BUN/Creatinine Ratio: 10 — ABNORMAL LOW (ref 12–28)
BUN: 13 mg/dL (ref 8–27)
Bilirubin Total: 0.4 mg/dL (ref 0.0–1.2)
CO2: 23 mmol/L (ref 20–29)
Calcium: 8.7 mg/dL (ref 8.7–10.3)
Chloride: 102 mmol/L (ref 96–106)
Creatinine, Ser: 1.3 mg/dL — ABNORMAL HIGH (ref 0.57–1.00)
GFR calc Af Amer: 49 mL/min/{1.73_m2} — ABNORMAL LOW (ref >59)
GFR calc non Af Amer: 43 mL/min/{1.73_m2} — ABNORMAL LOW (ref >59)
Globulin, Total: 3 g/dL (ref 1.5–4.5)
Glucose: 96 mg/dL (ref 65–99)
Potassium: 4.3 mmol/L (ref 3.5–5.2)
Sodium: 141 mmol/L (ref 134–144)
Total Protein: 6.7 g/dL (ref 6.0–8.5)

## 2020-07-12 LAB — HEMOGLOBIN A1C
Est. average glucose Bld gHb Est-mCnc: 103 mg/dL
Hgb A1c MFr Bld: 5.2 % (ref 4.8–5.6)

## 2020-07-12 LAB — LIPID PANEL
Chol/HDL Ratio: 3.3 ratio (ref 0.0–4.4)
Cholesterol, Total: 100 mg/dL (ref 100–199)
HDL: 30 mg/dL — ABNORMAL LOW (ref >39)
LDL Chol Calc (NIH): 43 mg/dL (ref 0–99)
Triglycerides: 160 mg/dL — ABNORMAL HIGH (ref 0–149)
VLDL Cholesterol Cal: 27 mg/dL (ref 5–40)

## 2020-08-16 ENCOUNTER — Other Ambulatory Visit: Payer: Self-pay

## 2020-08-16 ENCOUNTER — Telehealth: Payer: Self-pay | Admitting: Endocrinology

## 2020-08-16 DIAGNOSIS — E119 Type 2 diabetes mellitus without complications: Secondary | ICD-10-CM

## 2020-08-16 DIAGNOSIS — E782 Mixed hyperlipidemia: Secondary | ICD-10-CM

## 2020-08-16 NOTE — Telephone Encounter (Signed)
LVM--notified pt labs been released by [facility].

## 2020-08-16 NOTE — Telephone Encounter (Signed)
Patient is getting labs done at [facility] (shes wanting to go tomorrow) - can we release the lab orders so [facility] can see them?

## 2020-08-22 LAB — COMPREHENSIVE METABOLIC PANEL
ALT: 45 IU/L — ABNORMAL HIGH (ref 0–32)
AST: 92 IU/L — ABNORMAL HIGH (ref 0–40)
Albumin/Globulin Ratio: 1 — ABNORMAL LOW (ref 1.2–2.2)
Albumin: 3.2 g/dL — ABNORMAL LOW (ref 3.8–4.8)
Alkaline Phosphatase: 299 IU/L — ABNORMAL HIGH (ref 44–121)
BUN/Creatinine Ratio: 15 (ref 12–28)
BUN: 24 mg/dL (ref 8–27)
Bilirubin Total: 0.8 mg/dL (ref 0.0–1.2)
CO2: 22 mmol/L (ref 20–29)
Calcium: 9 mg/dL (ref 8.7–10.3)
Chloride: 102 mmol/L (ref 96–106)
Creatinine, Ser: 1.55 mg/dL — ABNORMAL HIGH (ref 0.57–1.00)
GFR calc Af Amer: 40 mL/min/{1.73_m2} — ABNORMAL LOW (ref >59)
GFR calc non Af Amer: 35 mL/min/{1.73_m2} — ABNORMAL LOW (ref >59)
Globulin, Total: 3.3 g/dL (ref 1.5–4.5)
Glucose: 106 mg/dL — ABNORMAL HIGH (ref 65–99)
Potassium: 4.6 mmol/L (ref 3.5–5.2)
Sodium: 143 mmol/L (ref 134–144)
Total Protein: 6.5 g/dL (ref 6.0–8.5)

## 2020-08-22 LAB — LIPID PANEL
Chol/HDL Ratio: 4.7 ratio — ABNORMAL HIGH (ref 0.0–4.4)
Cholesterol, Total: 84 mg/dL — ABNORMAL LOW (ref 100–199)
HDL: 18 mg/dL — ABNORMAL LOW (ref >39)
LDL Chol Calc (NIH): 34 mg/dL (ref 0–99)
Triglycerides: 196 mg/dL — ABNORMAL HIGH (ref 0–149)
VLDL Cholesterol Cal: 32 mg/dL (ref 5–40)

## 2020-08-22 LAB — HEMOGLOBIN A1C
Est. average glucose Bld gHb Est-mCnc: 103 mg/dL
Hgb A1c MFr Bld: 5.2 % (ref 4.8–5.6)

## 2020-08-29 NOTE — Progress Notes (Signed)
Patient ID: Kristin Olson, female   DOB: [birth year], 64 y.o.   MRN: [id number]  I connected with the above-named patient by video enabled telemedicine application and verified that I am speaking with the correct person. The patient was explained the limitations of evaluation and management by telemedicine and the availability of in person appointments.  Patient also understood that there may be a patient responsible charge related to this service . Location of the patient: Patient's home . Location of the provider: Physician office Only the patient and myself were participating in the encounter The patient understood the above statements and agreed to proceed.   Reason for Appointment: Endocrinology follow-up   History of Present Illness   Diagnosis: Type 2 DIABETES MELITUS, date of diagnosis: 1999      She has had mild diabetes for several years which has been usually well controlled with metformin alone  Current oral hypoglycemic drugs: Metformin 1 g bid  A1c is lower than expected for her blood sugars, again 5.2  Current blood sugars, problems identified and self management:  She has apparently been losing weight again and now is only 150 pounds with decreased appetite  She checks her blood sugars before and after breakfast only  Usually avoiding large amounts of carbohydrates  May have lost some weight after her hip surgery in March  Currently taking Metformin 1000 mg twice a day and usually has not had side effects from this, taking this long-term   Monitors blood glucose:  infrequently     Glucometer: One Touch.     Blood sugars at home range from 91-107 fasting After breakfast range 93-149   Side effects from medications: None            Wt Readings from Last 3 Encounters:  09/21/18 172 lb (78 kg)  03/04/18 173 lb 3.2 oz (78.6 kg)  11/04/17 183 lb (83 kg)     Lab Results  Component Value Date   HGBA1C 5.2 08/21/2020   HGBA1C 5.2 07/11/2020    HGBA1C 5.2 04/14/2020   Lab Results  Component Value Date   MICROALBUR 2.4 (H) 03/04/2018   LDLCALC 34 08/21/2020   CREATININE 1.55 (H) 08/21/2020   PRIOR history:  Because of somewhat higher blood sugars and weight gain she was tried on Byetta in 2006 and subsequently Victoza. Previously Victoza had helped her with portion control and weight loss.    However because she had difficulty with nausea when she was taking this it was stopped, also  could not afford this because of insurance noncoverage; she stopped taking this in 2014    OTHER active problems: See review of systems     Allergies as of 08/30/2020      Reactions   Levaquin [levofloxacin In D5w] Itching      Medication List       Accurate as of August 29, 2020  4:48 PM. If you have any questions, ask your nurse or doctor.        atorvastatin 10 MG tablet Commonly known as: LIPITOR Take 1 tablet by mouth daily.   cholestyramine light 4 g packet Commonly known as: PREVALITE Take 4 g by mouth 3 (three) times daily.   diazepam 5 MG tablet Commonly known as: VALIUM Take 5 mg by mouth 3 (three) times daily.   gabapentin 400 MG capsule Commonly known as: NEURONTIN Take 400 mg by mouth 2 (two) times daily.   metFORMIN 1000 MG tablet Commonly known as: GLUCOPHAGE Take 1  tablet by mouth twice daily.   mirtazapine 7.5 MG tablet Commonly known as: REMERON Take 2 mg by mouth at bedtime.   niacin 750 MG CR tablet Commonly known as: NIASPAN Take 1 tablet (750 mg total) at bedtime by mouth.   omega-3 acid ethyl esters 1 g capsule Commonly known as: LOVAZA Take 2 capsules by mouth twice daily.   omeprazole 20 MG capsule Commonly known as: PRILOSEC 40 mg.   OneTouch Delica Lancets 02V Misc 1 each by Other route 2 (two) times daily. Use as instructed to check blood sugar twice daily.DX:E11.9   OneTouch Verio Flex System w/Device Kit Use to check blood sugars daily 4 times   OneTouch Verio test  strip Generic drug: glucose blood Use as instructed to check blood sugar 2 times per day dx code E11.65   oxybutynin 15 MG 24 hr tablet Commonly known as: DITROPAN XL Take 15 mg by mouth 2 (two) times daily.   risperiDONE 2 MG tablet Commonly known as: RISPERDAL Take 2 mg by mouth once.   sertraline 100 MG tablet Commonly known as: ZOLOFT Take 100 mg by mouth daily. 2 tablets by mouth daily   SUMAtriptan 100 MG tablet Commonly known as: IMITREX Take 100 mg by mouth every 4 (four) hours as needed for migraine.   trimethoprim 100 MG tablet Commonly known as: TRIMPEX Take 100 mg by mouth 2 (two) times daily.       No past medical history on file.  No past surgical history on file.  Family History  Problem Relation Age of Onset  . Diabetes Mother   . Diabetes Maternal Grandfather     Social History:  reports that she has never smoked. She has never used smokeless tobacco. No history on file for alcohol use and drug use.  Allergies:  Allergies  Allergen Reactions  . Levaquin [Levofloxacin In D5w] Itching     Review of systems:  RENAL dysfunction: Not clear why her creatinine is higher again, has history of recurrent UTI  Lab Results  Component Value Date   CREATININE 1.55 (H) 08/21/2020   CREATININE 1.30 (H) 07/11/2020   CREATININE 1.36 (H) 04/14/2020     HYPERLIPIDEMIA: She has had high LDL, high triglycerides as also LDL particle number  and low HDL.   Previously her particle number was excellent at 555  She is taking Lipitor 10 mg daily and her LDL levels are below 70  Triglycerides are slightly higher compared to her last visit, on Lovaza but also on Colestid for diarrhea She said that Lovaza makes her nauseated but she takes it before eating Previously on fenofibrate  Also has very low HDL    Lab Results  Component Value Date   CHOL 84 (L) 08/21/2020   CHOL 100 07/11/2020   CHOL 131 04/14/2020   Lab Results  Component Value Date   HDL 18  (L) 08/21/2020   HDL 30 (L) 07/11/2020   HDL 28 (L) 04/14/2020   Lab Results  Component Value Date   LDLCALC 34 08/21/2020   LDLCALC 43 07/11/2020   LDLCALC 72 04/14/2020   Lab Results  Component Value Date   TRIG 196 (H) 08/21/2020   TRIG 160 (H) 07/11/2020   TRIG 183 (H) 04/14/2020   Lab Results  Component Value Date   CHOLHDL 4.7 (H) 08/21/2020   CHOLHDL 3.3 07/11/2020   CHOLHDL 4.7 (H) 04/14/2020   No results found for: LDLDIRECT   Lab Results  Component Value Date  ALT 45 (H) 08/21/2020     DEPRESSION: Controlled, long-standing, taking Risperdal 2 mg and Remeron  GI: She  had several surgeries because of biliary duct obstruction She has been followed at [facility]  Her liver functions appear to be worse, etiology unclear   Lab Results  Component Value Date   ALKPHOS 299 (H) 08/21/2020        Examination:   There were no vitals taken for this visit.  There is no height or weight on file to calculate BMI.    Assesment/PLAN:  Diabetes type 2, mild and treated with metformin 1 g twice daily  Her A1c is still low at 5.2 and home blood sugars are somewhat higher than expected However highest blood sugar is only 107 fasting and 149 after breakfast at home Lab glucose was 106 fasting  She has lost weight and has had decreased appetite Also because of her renal and hepatic dysfunction will need to stop her Metformin; also having issues with diarrhea which likely is unrelated   Mild increase in creatinine: She will need to discuss with her PCP  Abnormal liver tests: She will follow up with her gastroenterologist  HYPERLIPIDEMIA: She will continue to take Lipitor and Lovaza but she can try taking Lovaza 1 capsule after each meal for better tolerability Follow-up in 3 months again    Elayne Snare 08/29/2020, 4:48 PM

## 2020-08-30 ENCOUNTER — Other Ambulatory Visit: Payer: Self-pay

## 2020-08-30 ENCOUNTER — Telehealth (INDEPENDENT_AMBULATORY_CARE_PROVIDER_SITE_OTHER): Payer: Medicare Other | Admitting: Endocrinology

## 2020-08-30 VITALS — Wt 150.0 lb

## 2020-08-30 DIAGNOSIS — N289 Disorder of kidney and ureter, unspecified: Secondary | ICD-10-CM | POA: Diagnosis not present

## 2020-08-30 DIAGNOSIS — E119 Type 2 diabetes mellitus without complications: Secondary | ICD-10-CM | POA: Diagnosis not present

## 2020-08-30 DIAGNOSIS — E782 Mixed hyperlipidemia: Secondary | ICD-10-CM | POA: Diagnosis not present

## 2020-09-13 ENCOUNTER — Other Ambulatory Visit: Payer: Self-pay | Admitting: Endocrinology

## 2020-10-12 ENCOUNTER — Other Ambulatory Visit: Payer: Self-pay | Admitting: *Deleted

## 2020-10-12 MED ORDER — METFORMIN HCL 1000 MG PO TABS
1000.0000 mg | ORAL_TABLET | Freq: Two times a day (BID) | ORAL | 1 refills | Status: DC
Start: 2020-10-12 — End: 2020-12-01

## 2020-10-23 ENCOUNTER — Other Ambulatory Visit: Payer: Self-pay | Admitting: *Deleted

## 2020-10-23 MED ORDER — ATORVASTATIN CALCIUM 10 MG PO TABS
10.0000 mg | ORAL_TABLET | Freq: Every day | ORAL | 1 refills | Status: DC
Start: 2020-10-23 — End: 2021-02-20

## 2020-11-07 ENCOUNTER — Encounter: Payer: Self-pay | Admitting: *Deleted

## 2020-11-23 ENCOUNTER — Telehealth: Payer: Self-pay | Admitting: Endocrinology

## 2020-11-23 ENCOUNTER — Other Ambulatory Visit: Payer: Self-pay

## 2020-11-23 DIAGNOSIS — E782 Mixed hyperlipidemia: Secondary | ICD-10-CM

## 2020-11-23 DIAGNOSIS — E119 Type 2 diabetes mellitus without complications: Secondary | ICD-10-CM

## 2020-11-23 NOTE — Telephone Encounter (Signed)
Please have Boyd Kerbs release the labs and fax if needed

## 2020-11-23 NOTE — Telephone Encounter (Signed)
Patient called and is requesting that her labs be faxed to the [facility] in [place] on [address] so that she can get them done before her VV with Dr Lucianne Muss  Patient requested a call to let her know when they are faxed so she knows to go to [facility] - [phone]

## 2020-11-23 NOTE — Telephone Encounter (Signed)
Please advise 

## 2020-11-25 LAB — LIPID PANEL
Chol/HDL Ratio: 5.5 ratio — ABNORMAL HIGH (ref 0.0–4.4)
Cholesterol, Total: 143 mg/dL (ref 100–199)
HDL: 26 mg/dL — ABNORMAL LOW (ref >39)
LDL Chol Calc (NIH): 89 mg/dL (ref 0–99)
Triglycerides: 158 mg/dL — ABNORMAL HIGH (ref 0–149)
VLDL Cholesterol Cal: 28 mg/dL (ref 5–40)

## 2020-11-25 LAB — HEMOGLOBIN A1C
Est. average glucose Bld gHb Est-mCnc: 108 mg/dL
Hgb A1c MFr Bld: 5.4 % (ref 4.8–5.6)

## 2020-11-25 LAB — COMPREHENSIVE METABOLIC PANEL
ALT: 13 IU/L (ref 0–32)
AST: 34 IU/L (ref 0–40)
Albumin/Globulin Ratio: 1.1 — ABNORMAL LOW (ref 1.2–2.2)
Albumin: 3.7 g/dL — ABNORMAL LOW (ref 3.8–4.8)
Alkaline Phosphatase: 197 IU/L — ABNORMAL HIGH (ref 44–121)
BUN/Creatinine Ratio: 15 (ref 12–28)
BUN: 20 mg/dL (ref 8–27)
Bilirubin Total: 0.5 mg/dL (ref 0.0–1.2)
CO2: 24 mmol/L (ref 20–29)
Calcium: 9.5 mg/dL (ref 8.7–10.3)
Chloride: 107 mmol/L — ABNORMAL HIGH (ref 96–106)
Creatinine, Ser: 1.35 mg/dL — ABNORMAL HIGH (ref 0.57–1.00)
GFR calc Af Amer: 47 mL/min/{1.73_m2} — ABNORMAL LOW (ref >59)
GFR calc non Af Amer: 41 mL/min/{1.73_m2} — ABNORMAL LOW (ref >59)
Globulin, Total: 3.4 g/dL (ref 1.5–4.5)
Glucose: 123 mg/dL — ABNORMAL HIGH (ref 65–99)
Potassium: 4.9 mmol/L (ref 3.5–5.2)
Sodium: 144 mmol/L (ref 134–144)
Total Protein: 7.1 g/dL (ref 6.0–8.5)

## 2020-11-25 LAB — MICROALBUMIN / CREATININE URINE RATIO
Creatinine, Urine: 57.8 mg/dL
Microalb/Creat Ratio: <5 mg/g creat (ref 0–29)
Microalbumin, Urine: <3 ug/mL

## 2020-12-01 ENCOUNTER — Telehealth (INDEPENDENT_AMBULATORY_CARE_PROVIDER_SITE_OTHER): Payer: Medicare Other | Admitting: Endocrinology

## 2020-12-01 ENCOUNTER — Encounter: Payer: Self-pay | Admitting: Endocrinology

## 2020-12-01 ENCOUNTER — Other Ambulatory Visit: Payer: Self-pay

## 2020-12-01 VITALS — Ht 64.0 in | Wt 160.0 lb

## 2020-12-01 DIAGNOSIS — N289 Disorder of kidney and ureter, unspecified: Secondary | ICD-10-CM | POA: Diagnosis not present

## 2020-12-01 DIAGNOSIS — E782 Mixed hyperlipidemia: Secondary | ICD-10-CM | POA: Diagnosis not present

## 2020-12-01 DIAGNOSIS — K7689 Other specified diseases of liver: Secondary | ICD-10-CM | POA: Diagnosis not present

## 2020-12-01 DIAGNOSIS — E119 Type 2 diabetes mellitus without complications: Secondary | ICD-10-CM

## 2020-12-01 NOTE — Progress Notes (Signed)
Patient ID: Kristin Olson, female   DOB: [birth year], 65 y.o.   MRN: [id number]  I connected with the above-named patient by video enabled telemedicine application and verified that I am speaking with the correct person. The patient was explained the limitations of evaluation and management by telemedicine and the availability of in person appointments.  Patient also understood that there may be a patient responsible charge related to this service . Location of the patient: Patient's home . Location of the provider: Physician office Only the patient and myself were participating in the encounter The patient understood the above statements and agreed to proceed.   Reason for Appointment: Endocrinology follow-up   History of Present Illness   Diagnosis: Type 2 DIABETES MELITUS, date of diagnosis: 1999      She has had mild diabetes for several years which has been usually well controlled with metformin alone  Current oral hypoglycemic drugs: None  A1c is lower than expected for her blood sugars, now 5.4 compared to 5.2  Current blood sugars, problems identified and self management:  She was told to leave off her metformin since she had lost a lot of weight and was complaining of nausea  Although her nausea and decreased appetite are not better and she still has relatively low weight her blood sugars are not significantly higher  Blood sugars are near normal at home and in the lab was 123 fasting  Having apparently been losing weight again and now is only 150 pounds with decreased appetite  She checks her blood sugars before and after breakfast only  Usually avoiding large amounts of carbohydrates  May have lost some weight after her hip surgery in March  Currently taking Metformin 1000 mg twice a day and usually has not had side effects from this, taking this long-term   Monitors blood glucose:  infrequently     Glucometer: One Touch.     Blood sugars at home range  from 90-120 fasting, previously under 110 After meals 145-160, highest when eating dessert Previous range 93-149   Side effects from medications: None            Wt Readings from Last 3 Encounters:  08/30/20 150 lb (68 kg)  09/21/18 172 lb (78 kg)  03/04/18 173 lb 3.2 oz (78.6 kg)     Lab Results  Component Value Date   HGBA1C 5.4 11/24/2020   HGBA1C 5.2 08/21/2020   HGBA1C 5.2 07/11/2020   Lab Results  Component Value Date   MICROALBUR 2.4 (H) 03/04/2018   LDLCALC 89 11/24/2020   CREATININE 1.35 (H) 11/24/2020   PRIOR history:  Because of somewhat higher blood sugars and weight gain she was tried on Byetta in 2006 and subsequently Victoza. Previously Victoza had helped her with portion control and weight loss.    However because she had difficulty with nausea when she was taking this it was stopped, also  could not afford this because of insurance noncoverage; she stopped taking this in 2014    OTHER active problems: See review of systems     Allergies as of 12/01/2020      Reactions   Levaquin [levofloxacin In D5w] Itching      Medication List       Accurate as of December 01, 2020  8:10 AM. If you have any questions, ask your nurse or doctor.        atorvastatin 10 MG tablet Commonly known as: LIPITOR Take 1 tablet (10 mg total) by  mouth daily.   colestipol 1 g tablet Commonly known as: COLESTID Take 1 g by mouth 2 (two) times daily.   diazepam 5 MG tablet Commonly known as: VALIUM Take 5 mg by mouth 3 (three) times daily.   gabapentin 400 MG capsule Commonly known as: NEURONTIN Take 400 mg by mouth 2 (two) times daily.   metFORMIN 1000 MG tablet Commonly known as: GLUCOPHAGE Take 1 tablet (1,000 mg total) by mouth 2 (two) times daily.   mirtazapine 7.5 MG tablet Commonly known as: REMERON Take 2 mg by mouth at bedtime.   omega-3 acid ethyl esters 1 g capsule Commonly known as: LOVAZA Take 2 capsules by mouth twice daily.   omeprazole 20  MG capsule Commonly known as: PRILOSEC 40 mg.   OneTouch Delica Lancets 01V Misc 1 each by Other route 2 (two) times daily. Use as instructed to check blood sugar twice daily.DX:E11.9   OneTouch Verio Flex System w/Device Kit Use to check blood sugars daily 4 times   OneTouch Verio test strip Generic drug: glucose blood Use as instructed to check blood sugar 2 times per day dx code E11.65   oxybutynin 15 MG 24 hr tablet Commonly known as: DITROPAN XL Take 15 mg by mouth 2 (two) times daily.   risperiDONE 2 MG tablet Commonly known as: RISPERDAL Take 2 mg by mouth once.   sertraline 100 MG tablet Commonly known as: ZOLOFT Take 100 mg by mouth daily. 2 tablets by mouth daily   sucralfate 1 g tablet Commonly known as: CARAFATE Take 1 g by mouth 4 (four) times daily -  with meals and at bedtime.   SUMAtriptan 100 MG tablet Commonly known as: IMITREX Take 100 mg by mouth every 4 (four) hours as needed for migraine.   trimethoprim 100 MG tablet Commonly known as: TRIMPEX Take 100 mg by mouth 2 (two) times daily.       No past medical history on file.  No past surgical history on file.  Family History  Problem Relation Age of Onset  . Diabetes Mother   . Diabetes Maternal Grandfather     Social History:  reports that she has never smoked. She has never used smokeless tobacco. No history on file for alcohol use and drug use.  Allergies:  Allergies  Allergen Reactions  . Levaquin [Levofloxacin In D5w] Itching     Review of systems:  RENAL dysfunction: Creatinine has been variable  She has history of recurrent UTI  Lab Results  Component Value Date   CREATININE 1.35 (H) 11/24/2020   CREATININE 1.55 (H) 08/21/2020   CREATININE 1.30 (H) 07/11/2020     HYPERLIPIDEMIA: She has had high LDL, high triglycerides as also LDL particle number  and low HDL.   Previously her particle number was excellent at 555  She is taking Lipitor 10 mg daily and her LDL  levels are below 70  Triglycerides are relatively better compared to her last visit, on Lovaza She is also on Colestid for diarrhea She does not think Lovaza is causing nausea, she is currently taking this before lunch and at bedtime Previously on fenofibrate  Also has persistently low HDL    Lab Results  Component Value Date   CHOL 143 11/24/2020   CHOL 84 (L) 08/21/2020   CHOL 100 07/11/2020   Lab Results  Component Value Date   HDL 26 (L) 11/24/2020   HDL 18 (L) 08/21/2020   HDL 30 (L) 07/11/2020   Lab Results  Component  Value Date   LDLCALC 89 11/24/2020   LDLCALC 34 08/21/2020   LDLCALC 43 07/11/2020   Lab Results  Component Value Date   TRIG 158 (H) 11/24/2020   TRIG 196 (H) 08/21/2020   TRIG 160 (H) 07/11/2020   Lab Results  Component Value Date   CHOLHDL 5.5 (H) 11/24/2020   CHOLHDL 4.7 (H) 08/21/2020   CHOLHDL 3.3 07/11/2020   No results found for: LDLDIRECT   Lab Results  Component Value Date   ALT 13 11/24/2020     GI: She  had several surgeries because of biliary duct obstruction She has been followed at [facility] and no specific treatment given recently for liver function abnormality  Her liver functions are improving although alkaline phosphatase still high   Lab Results  Component Value Date   ALKPHOS 197 (H) 11/24/2020        Examination:   There were no vitals taken for this visit.  There is no height or weight on file to calculate BMI.    Assesment/PLAN:  Diabetes type 2, mild and treated with metformin 1 g twice daily  Her A1c is still below normal at 5.4 This is without any Metformin now Since she still has anorexia and nausea it is unlikely that she was having side effects from Metformin However considering that her weight is down significantly and her blood sugars are near normal does not need pharmacological treatment at this time She will continue to be watching her diet with avoiding high carbohydrate and high fat  foods   Mild increase in creatinine: Relatively stable  Abnormal liver tests: Improving, she will follow up with her gastroenterologist  HYPERLIPIDEMIA: Controlled with triglycerides only slightly above 150 She will continue to take Lipitor and Lovaza She can take Lovaza after eating lunch and dinner  Follow-up in 6 months    Arjen Deringer 12/01/2020, 8:10 AM

## 2021-02-07 ENCOUNTER — Other Ambulatory Visit: Payer: Self-pay | Admitting: Endocrinology

## 2021-02-20 ENCOUNTER — Telehealth: Payer: Self-pay | Admitting: Endocrinology

## 2021-02-20 ENCOUNTER — Other Ambulatory Visit: Payer: Self-pay | Admitting: *Deleted

## 2021-02-20 MED ORDER — ATORVASTATIN CALCIUM 10 MG PO TABS: 10.0000 mg | ORAL_TABLET | Freq: Every day | ORAL | 1 refills | Status: DC

## 2021-02-20 NOTE — Telephone Encounter (Signed)
Rx sent 

## 2021-02-20 NOTE — Telephone Encounter (Signed)
Pt is needing a refill for   atorvastatin (LIPITOR) 10 MG tablet  [facility] ([facility]) [facility] - [place], [place] - [address] AT [address]

## 2021-03-16 ENCOUNTER — Telehealth: Payer: Self-pay | Admitting: Endocrinology

## 2021-03-16 NOTE — Telephone Encounter (Signed)
Pt would like lab orders sent to    [facility]  [address] [place], [place] [address]

## 2021-03-17 NOTE — Telephone Encounter (Signed)
Please advise 

## 2021-03-22 ENCOUNTER — Other Ambulatory Visit: Payer: Self-pay | Admitting: Endocrinology

## 2021-03-22 ENCOUNTER — Other Ambulatory Visit: Payer: Self-pay

## 2021-03-22 DIAGNOSIS — E119 Type 2 diabetes mellitus without complications: Secondary | ICD-10-CM

## 2021-03-22 DIAGNOSIS — R634 Abnormal weight loss: Secondary | ICD-10-CM

## 2021-03-22 DIAGNOSIS — E782 Mixed hyperlipidemia: Secondary | ICD-10-CM

## 2021-03-22 NOTE — Telephone Encounter (Signed)
Labs have been ordered, she can call a couple of days before she goes and we can electronically release them to [employer] [facility] [employer]

## 2021-03-27 LAB — TSH: TSH: 1.95 u[IU]/mL (ref 0.450–4.500)

## 2021-03-27 LAB — LIPID PANEL
Chol/HDL Ratio: 3.2 ratio (ref 0.0–4.4)
Cholesterol, Total: 111 mg/dL (ref 100–199)
HDL: 35 mg/dL — ABNORMAL LOW (ref >39)
LDL Chol Calc (NIH): 57 mg/dL (ref 0–99)
Triglycerides: 102 mg/dL (ref 0–149)
VLDL Cholesterol Cal: 19 mg/dL (ref 5–40)

## 2021-03-27 LAB — COMPREHENSIVE METABOLIC PANEL
ALT: 15 IU/L (ref 0–32)
AST: 28 IU/L (ref 0–40)
Albumin/Globulin Ratio: 1.1 — ABNORMAL LOW (ref 1.2–2.2)
Albumin: 3.6 g/dL — ABNORMAL LOW (ref 3.8–4.8)
Alkaline Phosphatase: 179 IU/L — ABNORMAL HIGH (ref 44–121)
BUN/Creatinine Ratio: 7 — ABNORMAL LOW (ref 12–28)
BUN: 10 mg/dL (ref 8–27)
Bilirubin Total: 0.5 mg/dL (ref 0.0–1.2)
CO2: 21 mmol/L (ref 20–29)
Calcium: 9.2 mg/dL (ref 8.7–10.3)
Chloride: 104 mmol/L (ref 96–106)
Creatinine, Ser: 1.53 mg/dL — ABNORMAL HIGH (ref 0.57–1.00)
Globulin, Total: 3.2 g/dL (ref 1.5–4.5)
Glucose: 144 mg/dL — ABNORMAL HIGH (ref 65–99)
Potassium: 4.8 mmol/L (ref 3.5–5.2)
Sodium: 139 mmol/L (ref 134–144)
Total Protein: 6.8 g/dL (ref 6.0–8.5)
eGFR: 37 mL/min/{1.73_m2} — ABNORMAL LOW (ref >59)

## 2021-03-27 LAB — HEMOGLOBIN A1C
Est. average glucose Bld gHb Est-mCnc: 111 mg/dL
Hgb A1c MFr Bld: 5.5 % (ref 4.8–5.6)

## 2021-04-13 ENCOUNTER — Telehealth (INDEPENDENT_AMBULATORY_CARE_PROVIDER_SITE_OTHER): Payer: Medicare Other | Admitting: Endocrinology

## 2021-04-13 ENCOUNTER — Encounter: Payer: Self-pay | Admitting: Endocrinology

## 2021-04-13 ENCOUNTER — Other Ambulatory Visit: Payer: Self-pay

## 2021-04-13 VITALS — Ht 64.0 in | Wt 162.0 lb

## 2021-04-13 DIAGNOSIS — E119 Type 2 diabetes mellitus without complications: Secondary | ICD-10-CM

## 2021-04-13 DIAGNOSIS — E782 Mixed hyperlipidemia: Secondary | ICD-10-CM

## 2021-04-13 DIAGNOSIS — N289 Disorder of kidney and ureter, unspecified: Secondary | ICD-10-CM | POA: Diagnosis not present

## 2021-04-13 NOTE — Progress Notes (Signed)
Patient ID: Kristin Olson, female   DOB: 1956/05/19, 65 y.o.   MRN: [id number]  I connected with the above-named patient by video enabled telemedicine application and verified that I am speaking with the correct person. The patient was explained the limitations of evaluation and management by telemedicine and the availability of in person appointments.  Patient also understood that there may be a patient responsible charge related to this service  Location of the patient: Patient's home  Location of the provider: Physician office Only the patient and myself were participating in the encounter The patient understood the above statements and agreed to proceed.   Reason for Appointment: Endocrinology follow-up   History of Present Illness   Diagnosis: Type 2 DIABETES MELITUS, date of diagnosis: 1999      She has had mild diabetes for several years which has been usually well controlled with metformin alone  Current oral hypoglycemic drugs: None  A1c is lower than expected for her blood sugars, now 5.5  Current blood sugars, problems identified and self management: She has been off metformin since she had lost a lot of weight and was complaining of nausea previously She thinks her weight is about the same although her appetite is inconsistent Blood sugars are still above the normal range in the low 100s However fasting blood sugar is relatively higher at 144 She is avoiding all regular soft drinks Also generally trying to eat healthy meals  not able to exercise because of various intercurrent medical problems and pains   Monitors blood glucose:  infrequently     Glucometer: One Touch.     Blood sugars at home as above   Side effects from medications:?  Nausea from Victoza            Wt Readings from Last 3 Encounters:  12/01/20 160 lb (72.6 kg)  08/30/20 150 lb (68 kg)  09/21/18 172 lb (78 kg)     Lab Results  Component Value Date   HGBA1C 5.5 03/26/2021    HGBA1C 5.4 11/24/2020   HGBA1C 5.2 08/21/2020   Lab Results  Component Value Date   MICROALBUR 2.4 (H) 03/04/2018   LDLCALC 57 03/26/2021   CREATININE 1.53 (H) 03/26/2021   PRIOR history:  Because of somewhat higher blood sugars and weight gain she was tried on Byetta in 2006 and subsequently Victoza. Previously Victoza had helped her with portion control and weight loss.    However because she had difficulty with nausea when she was taking this it was stopped, also  could not afford this because of insurance noncoverage; she stopped taking this in 2014    OTHER active problems: See review of systems     Allergies as of 04/13/2021       Reactions   Levaquin [levofloxacin In D5w] Itching        Medication List        Accurate as of April 13, 2021  8:08 AM. If you have any questions, ask your nurse or doctor.          atorvastatin 10 MG tablet Commonly known as: LIPITOR Take 1 tablet (10 mg total) by mouth daily.   colestipol 1 g tablet Commonly known as: COLESTID Take 1 g by mouth 2 (two) times daily.   diazepam 5 MG tablet Commonly known as: VALIUM Take 5 mg by mouth 3 (three) times daily.   gabapentin 400 MG capsule Commonly known as: NEURONTIN Take 400 mg by mouth 2 (two) times daily.  mirtazapine 7.5 MG tablet Commonly known as: REMERON Take 2 mg by mouth at bedtime.   omega-3 acid ethyl esters 1 g capsule Commonly known as: LOVAZA TAKE 2 CAPSULES BY MOUTH TWICE DAILY   omeprazole 20 MG capsule Commonly known as: PRILOSEC 40 mg.   OneTouch Delica Lancets 13K Misc 1 each by Other route 2 (two) times daily. Use as instructed to check blood sugar twice daily.DX:E11.9   OneTouch Verio Flex System w/Device Kit Use to check blood sugars daily 4 times   OneTouch Verio test strip Generic drug: glucose blood Use as instructed to check blood sugar 2 times per day dx code E11.65   oxybutynin 15 MG 24 hr tablet Commonly known as: DITROPAN XL Take  15 mg by mouth 2 (two) times daily.   risperiDONE 2 MG tablet Commonly known as: RISPERDAL Take 2 mg by mouth once.   sertraline 100 MG tablet Commonly known as: ZOLOFT Take 100 mg by mouth daily. 2 tablets by mouth daily   sucralfate 1 g tablet Commonly known as: CARAFATE Take 1 g by mouth 4 (four) times daily -  with meals and at bedtime.   SUMAtriptan 100 MG tablet Commonly known as: IMITREX Take 100 mg by mouth every 4 (four) hours as needed for migraine.   trimethoprim 100 MG tablet Commonly known as: TRIMPEX Take 100 mg by mouth 2 (two) times daily.        No past medical history on file.  No past surgical history on file.  Family History  Problem Relation Age of Onset   Diabetes Mother    Diabetes Maternal Grandfather     Social History:  reports that she has never smoked. She has never used smokeless tobacco. No history on file for alcohol use and drug use.  Allergies:  Allergies  Allergen Reactions   Levaquin [Levofloxacin In D5w] Itching     Review of systems:  RENAL dysfunction: Creatinine has been variable  She has history of recurrent UTI  Lab Results  Component Value Date   CREATININE 1.53 (H) 03/26/2021   CREATININE 1.35 (H) 11/24/2020   CREATININE 1.55 (H) 08/21/2020     HYPERLIPIDEMIA: She has had high LDL, high triglycerides as also LDL particle number  and low HDL.   Previously her particle number was excellent at 555  She is taking Lipitor 10 mg daily and her LDL levels are below 70  Triglycerides are relatively better and now back to normal, on Lovaza She is also on Colestid for diarrhea she has been asked to take Lovaza at breakfast and dinner Previously on fenofibrate  Also has persistently low HDL which has improved    Lab Results  Component Value Date   CHOL 111 03/26/2021   CHOL 143 11/24/2020   CHOL 84 (L) 08/21/2020   Lab Results  Component Value Date   HDL 35 (L) 03/26/2021   HDL 26 (L) 11/24/2020   HDL 18  (L) 08/21/2020   Lab Results  Component Value Date   LDLCALC 57 03/26/2021   LDLCALC 89 11/24/2020   LDLCALC 34 08/21/2020   Lab Results  Component Value Date   TRIG 102 03/26/2021   TRIG 158 (H) 11/24/2020   TRIG 196 (H) 08/21/2020   Lab Results  Component Value Date   CHOLHDL 3.2 03/26/2021   CHOLHDL 5.5 (H) 11/24/2020   CHOLHDL 4.7 (H) 08/21/2020   No results found for: LDLDIRECT   Lab Results  Component Value Date   ALT 15  03/26/2021     GI: She  had several surgeries because of biliary duct obstruction She has been [facility]  Her liver functions are improving although alkaline phosphatase is persistently high   Lab Results  Component Value Date   ALKPHOS 179 (H) 03/26/2021    FATIGUE: Etiology unclear and most recent B12 was normal, no significant anemia and TSH is also normal    Examination:   There were no vitals taken for this visit.  There is no height or weight on file to calculate BMI.    Assesment/PLAN:  Diabetes type 2, mild and currently not on treatment  Her A1c is still below normal at 5.5 Previously Victoza and metformin has been stopped because of nausea and tendency to weight loss Also she now thinks that she has had some pedal edema Unable to take SGLT2 drugs because of renal dysfunction and may be contraindicated because of recurrent urinary infections  Blood sugars are averaging about 125 at home and was 144 in the lab, higher than expected for her A1c again   Mild increase in creatinine: Variable but her creatinine clearance is now 37   HYPERLIPIDEMIA: Controlled with triglycerides back to normal with improved HDL  She will continue to take Lipitor and Lovaza   Follow-up in 3 months to consider further treatment for hyperglycemia based on overall status and renal function    Elayne Snare 04/13/2021, 8:08 AM

## 2021-04-19 ENCOUNTER — Other Ambulatory Visit: Payer: Self-pay | Admitting: Endocrinology

## 2021-07-02 ENCOUNTER — Other Ambulatory Visit: Payer: Self-pay

## 2021-07-02 DIAGNOSIS — N289 Disorder of kidney and ureter, unspecified: Secondary | ICD-10-CM

## 2021-07-02 DIAGNOSIS — E119 Type 2 diabetes mellitus without complications: Secondary | ICD-10-CM

## 2021-07-02 DIAGNOSIS — E782 Mixed hyperlipidemia: Secondary | ICD-10-CM

## 2021-07-07 LAB — COMPREHENSIVE METABOLIC PANEL
ALT: 12 IU/L (ref 0–32)
AST: 19 IU/L (ref 0–40)
Albumin/Globulin Ratio: 1.1 — ABNORMAL LOW (ref 1.2–2.2)
Albumin: 3.2 g/dL — ABNORMAL LOW (ref 3.8–4.8)
Alkaline Phosphatase: 136 IU/L — ABNORMAL HIGH (ref 44–121)
BUN/Creatinine Ratio: 5 — ABNORMAL LOW (ref 12–28)
BUN: 7 mg/dL — ABNORMAL LOW (ref 8–27)
Bilirubin Total: 0.3 mg/dL (ref 0.0–1.2)
CO2: 24 mmol/L (ref 20–29)
Calcium: 8.3 mg/dL — ABNORMAL LOW (ref 8.7–10.3)
Chloride: 105 mmol/L (ref 96–106)
Creatinine, Ser: 1.34 mg/dL — ABNORMAL HIGH (ref 0.57–1.00)
Globulin, Total: 2.9 g/dL (ref 1.5–4.5)
Glucose: 106 mg/dL — ABNORMAL HIGH (ref 65–99)
Potassium: 4 mmol/L (ref 3.5–5.2)
Sodium: 145 mmol/L — ABNORMAL HIGH (ref 134–144)
Total Protein: 6.1 g/dL (ref 6.0–8.5)
eGFR: 43 mL/min/{1.73_m2} — ABNORMAL LOW (ref >59)

## 2021-07-07 LAB — HEMOGLOBIN A1C
Est. average glucose Bld gHb Est-mCnc: 126 mg/dL
Hgb A1c MFr Bld: 6 % — ABNORMAL HIGH (ref 4.8–5.6)

## 2021-07-07 LAB — LIPID PANEL
Chol/HDL Ratio: 3 ratio (ref 0.0–4.4)
Cholesterol, Total: 99 mg/dL — ABNORMAL LOW (ref 100–199)
HDL: 33 mg/dL — ABNORMAL LOW (ref >39)
LDL Chol Calc (NIH): 48 mg/dL (ref 0–99)
Triglycerides: 94 mg/dL (ref 0–149)
VLDL Cholesterol Cal: 18 mg/dL (ref 5–40)

## 2021-07-17 ENCOUNTER — Telehealth (INDEPENDENT_AMBULATORY_CARE_PROVIDER_SITE_OTHER): Payer: Medicare Other | Admitting: Endocrinology

## 2021-07-17 ENCOUNTER — Encounter: Payer: Self-pay | Admitting: Endocrinology

## 2021-07-17 ENCOUNTER — Other Ambulatory Visit: Payer: Self-pay

## 2021-07-17 VITALS — Ht 64.0 in | Wt 178.0 lb

## 2021-07-17 DIAGNOSIS — E782 Mixed hyperlipidemia: Secondary | ICD-10-CM

## 2021-07-17 DIAGNOSIS — E119 Type 2 diabetes mellitus without complications: Secondary | ICD-10-CM

## 2021-07-17 DIAGNOSIS — N289 Disorder of kidney and ureter, unspecified: Secondary | ICD-10-CM | POA: Diagnosis not present

## 2021-07-17 NOTE — Progress Notes (Signed)
Patient ID: Kristin Olson, female   DOB: November 06, 1955, 65 y.o.   MRN: [id number]  I connected with the above-named patient by video enabled telemedicine application and verified that I am speaking with the correct person. The patient was explained the limitations of evaluation and management by telemedicine and the availability of in person appointments.  Patient also understood that there may be a patient responsible charge related to this service  Location of the patient: Patient's home  Location of the provider: Physician office Only the patient and myself were participating in the encounter The patient understood the above statements and agreed to proceed.   Reason for Appointment: Endocrinology follow-up   History of Present Illness   Diagnosis: Type 2 DIABETES MELITUS, date of diagnosis: 1999      She has had mild diabetes for several years which has been usually well controlled with metformin alone  Current oral hypoglycemic drugs: None  A1c is lower than expected for her blood sugars, now 6% compared to 5.5  Current blood sugars, problems identified and self management: She has been checking her blood sugars fairly regularly either morning or 2 hours after dinner She has some fluctuation in her blood sugars both morning and evening but has readings as low as 91 in the morning and bedtime She thinks her blood sugars are higher after supper when she is drinking sweet tea Because of her decreased appetite and nausea she is not able to eat full or balanced meals She can mostly tolerate plain yogurt and may not be getting much protein otherwise Again because of various problems she is not able to do any walking or other activities Her weight may be higher but she has had more swelling also She is avoiding all regular soft drinks   Monitors blood glucose:  infrequently     Glucometer: One Touch.     Blood sugars at home by review of monitor on the phone   PRE-MEAL  Fasting Lunch Dinner Bedtime Overall  Glucose range: 91-167      Mean/median:     ?   POST-MEAL PC Breakfast PC Lunch PC Dinner  Glucose range:   91-180  Mean/median:         Side effects from medications:?  Nausea from Victoza            Wt Readings from Last 3 Encounters:  07/17/21 178 lb (80.7 kg)  04/13/21 162 lb (73.5 kg)  12/01/20 160 lb (72.6 kg)     Lab Results  Component Value Date   HGBA1C 6.0 (H) 07/06/2021   HGBA1C 5.5 03/26/2021   HGBA1C 5.4 11/24/2020   Lab Results  Component Value Date   MICROALBUR 2.4 (H) 03/04/2018   LDLCALC 48 07/06/2021   CREATININE 1.34 (H) 07/06/2021   PRIOR history:  Because of somewhat higher blood sugars and weight gain she was tried on Byetta in 2006 and subsequently Victoza. Previously Victoza had helped her with portion control and weight loss.    However because she had difficulty with nausea when she was taking this it was stopped, also  could not afford this because of insurance noncoverage; she stopped taking this in 2014    OTHER active problems: See review of systems     Allergies as of 07/17/2021       Reactions   Levaquin [levofloxacin In D5w] Itching        Medication List        Accurate as of July 17, 2021  9:02 AM. If you have any questions, ask your nurse or doctor.          atorvastatin 10 MG tablet Commonly known as: LIPITOR Take 1 tablet (10 mg total) by mouth daily.   colestipol 1 g tablet Commonly known as: COLESTID Take 1 g by mouth 2 (two) times daily.   diazepam 5 MG tablet Commonly known as: VALIUM Take 5 mg by mouth 3 (three) times daily.   gabapentin 400 MG capsule Commonly known as: NEURONTIN Take 400 mg by mouth 2 (two) times daily.   mirtazapine 7.5 MG tablet Commonly known as: REMERON Take 2 mg by mouth at bedtime.   omega-3 acid ethyl esters 1 g capsule Commonly known as: LOVAZA TAKE 2 CAPSULES BY MOUTH TWICE DAILY   omeprazole 20 MG capsule Commonly  known as: PRILOSEC 40 mg.   OneTouch Delica Lancets 93X Misc 1 each by Other route 2 (two) times daily. Use as instructed to check blood sugar twice daily.DX:E11.9   OneTouch Verio Flex System w/Device Kit Use to check blood sugars daily 4 times   OneTouch Verio test strip Generic drug: glucose blood USE  STRIP TO CHECK GLUCOSE TWICE DAILY   oxybutynin 15 MG 24 hr tablet Commonly known as: DITROPAN XL Take 15 mg by mouth 2 (two) times daily.   risperiDONE 2 MG tablet Commonly known as: RISPERDAL Take 2 mg by mouth once.   sertraline 100 MG tablet Commonly known as: ZOLOFT Take 100 mg by mouth daily. 2 tablets by mouth daily   sucralfate 1 g tablet Commonly known as: CARAFATE Take 1 g by mouth 4 (four) times daily -  with meals and at bedtime.   SUMAtriptan 100 MG tablet Commonly known as: IMITREX Take 100 mg by mouth every 4 (four) hours as needed for migraine.   trimethoprim 100 MG tablet Commonly known as: TRIMPEX Take 100 mg by mouth 2 (two) times daily.        No past medical history on file.  No past surgical history on file.  Family History  Problem Relation Age of Onset   Diabetes Mother    Diabetes Maternal Grandfather     Social History:  reports that she quit smoking about 22 years ago. Her smoking use included cigarettes. She has never used smokeless tobacco. No history on file for alcohol use and drug use.  Allergies:  Allergies  Allergen Reactions   Levaquin [Levofloxacin In D5w] Itching     Review of systems:  RENAL dysfunction: Creatinine has been variable  She has history of recurrent UTI  Lab Results  Component Value Date   CREATININE 1.34 (H) 07/06/2021   CREATININE 1.53 (H) 03/26/2021   CREATININE 1.35 (H) 11/24/2020     HYPERLIPIDEMIA: She has had high LDL, high triglycerides as also LDL particle number  and low HDL.   Previously her particle number was excellent at 555  She is taking Lipitor 10 mg daily and her LDL  levels are decreasing progressively and now only 48  Triglycerides now below 100 She is on Lovaza She is also on Colestid for diarrhea  Previously on fenofibrate  Also has persistently low HDL    Lab Results  Component Value Date   CHOL 99 (L) 07/06/2021   CHOL 111 03/26/2021   CHOL 143 11/24/2020   Lab Results  Component Value Date   HDL 33 (L) 07/06/2021   HDL 35 (L) 03/26/2021   HDL 26 (L) 11/24/2020   Lab Results  Component Value Date   LDLCALC 48 07/06/2021   LDLCALC 57 03/26/2021   LDLCALC 89 11/24/2020   Lab Results  Component Value Date   TRIG 94 07/06/2021   TRIG 102 03/26/2021   TRIG 158 (H) 11/24/2020   Lab Results  Component Value Date   CHOLHDL 3.0 07/06/2021   CHOLHDL 3.2 03/26/2021   CHOLHDL 5.5 (H) 11/24/2020   No results found for: LDLDIRECT   Lab Results  Component Value Date   ALT 12 07/06/2021     GI: She  had several surgeries because of biliary duct obstruction She has been [facility] She is now going to [facility] for evaluation of her persistent nausea  Her liver functions are improving although alkaline phosphatase is still slightly high   Lab Results  Component Value Date   ALKPHOS 136 (H) 07/06/2021    B12 was normal, no significant anemia and TSH is also normal    Examination:   Ht _0  (1.626 m)   Wt 178 lb (80.7 kg) Comment: Pt provided  BMI 30.55 kg/m   Body mass index is 30.55 kg/m.    Assesment/PLAN:  Diabetes type 2, mild and currently not on treatment  Her A1c is 6% Previously Victoza and metformin had been discontinued because of relatively good blood sugars and her weight loss with decreased appetite  She has only occasional high blood sugars based on her diet or intake of sweet tea Since A1c is still in the prediabetic range and she does not have any consistent hyperglycemia will continue to observe without treatment She will try to cut back on sweet drinks and try to have balanced meals with  some protein at each meal  Mild increase in creatinine: Variable, etiology unclear  Edema and hypoalbuminemia: She will discuss this with her PCP and gastroenterologist  HYPERLIPIDEMIA: Controlled with triglycerides very normal with some persistent decrease in HDL Since her intake is decreased and her LDL is only 48 will hold off on Lipitor for now  Follow-up in 4 months   Kristin Olson 07/17/2021, 9:02 AM

## 2021-08-08 ENCOUNTER — Telehealth: Payer: Self-pay | Admitting: Endocrinology

## 2021-08-08 DIAGNOSIS — E119 Type 2 diabetes mellitus without complications: Secondary | ICD-10-CM

## 2021-08-08 NOTE — Telephone Encounter (Signed)
Pt calling to request a new meter, plus supplies, that her ins can cover. She dropped it and it broke into pieces. Please send new meter to   [facility] - [place], [place] - [address]  Pt contact [phone]

## 2021-08-09 MED ORDER — ONETOUCH DELICA LANCETS 30G MISC: 1.0000 | Freq: Two times a day (BID) | 2 refills | Status: AC

## 2021-08-09 MED ORDER — ONETOUCH VERIO VI STRP: ORAL_STRIP | 0 refills | Status: DC

## 2021-08-09 MED ORDER — ONETOUCH VERIO REFLECT W/DEVICE KIT: PACK | 0 refills | Status: AC

## 2021-08-09 NOTE — Telephone Encounter (Signed)
Rx sent to preferred pharmacy. Patient notified.

## 2021-11-09 ENCOUNTER — Telehealth: Payer: Self-pay

## 2021-11-09 NOTE — Telephone Encounter (Signed)
Patient called in and stated that she is requesting the labs that Dr. Dwyane Dee is needing to be sent to [facility] at [place], [place], [place] [address]. Patient is requesting a call when the orders are placed so she can go have them drawn.  Please advise

## 2021-11-12 ENCOUNTER — Other Ambulatory Visit: Payer: Self-pay | Admitting: Endocrinology

## 2021-11-12 ENCOUNTER — Other Ambulatory Visit: Payer: Self-pay

## 2021-11-12 DIAGNOSIS — E782 Mixed hyperlipidemia: Secondary | ICD-10-CM

## 2021-11-12 DIAGNOSIS — E119 Type 2 diabetes mellitus without complications: Secondary | ICD-10-CM

## 2021-11-12 NOTE — Telephone Encounter (Signed)
Patient called to request that her labs be sent to the [facility] at [address], [place], [place] [address]. Patient is requesting a call when the orders are placed so she can go have them drawn.  Call back # [phone]

## 2021-11-12 NOTE — Telephone Encounter (Signed)
Pt is calling in to check the status of the below msg and would like to have a call back once it has been sent to [facility] so that she is able to go and get it done before her appointment on Friday 11/16/2021 w/Dr.Kumar.

## 2021-11-14 LAB — MICROALBUMIN / CREATININE URINE RATIO
Creatinine, Urine: 55.5 mg/dL
Microalb/Creat Ratio: <5 mg/g creat (ref 0–29)
Microalbumin, Urine: <3 ug/mL

## 2021-11-14 LAB — BASIC METABOLIC PANEL
BUN/Creatinine Ratio: 9 — ABNORMAL LOW (ref 12–28)
BUN: 13 mg/dL (ref 8–27)
CO2: 21 mmol/L (ref 20–29)
Calcium: 8.5 mg/dL — ABNORMAL LOW (ref 8.7–10.3)
Chloride: 104 mmol/L (ref 96–106)
Creatinine, Ser: 1.4 mg/dL — ABNORMAL HIGH (ref 0.57–1.00)
Glucose: 151 mg/dL — ABNORMAL HIGH (ref 70–99)
Potassium: 3.8 mmol/L (ref 3.5–5.2)
Sodium: 140 mmol/L (ref 134–144)
eGFR: 41 mL/min/{1.73_m2} — ABNORMAL LOW (ref >59)

## 2021-11-14 LAB — LIPID PANEL
Chol/HDL Ratio: 3.9 ratio (ref 0.0–4.4)
Cholesterol, Total: 129 mg/dL (ref 100–199)
HDL: 33 mg/dL — ABNORMAL LOW (ref >39)
LDL Chol Calc (NIH): 74 mg/dL (ref 0–99)
Triglycerides: 124 mg/dL (ref 0–149)
VLDL Cholesterol Cal: 22 mg/dL (ref 5–40)

## 2021-11-14 LAB — HEMOGLOBIN A1C
Est. average glucose Bld gHb Est-mCnc: 134 mg/dL
Hgb A1c MFr Bld: 6.3 % — ABNORMAL HIGH (ref 4.8–5.6)

## 2021-11-16 ENCOUNTER — Telehealth: Payer: Medicare Other | Admitting: Endocrinology

## 2021-12-14 ENCOUNTER — Telehealth (INDEPENDENT_AMBULATORY_CARE_PROVIDER_SITE_OTHER): Payer: Medicare Other | Admitting: Endocrinology

## 2021-12-14 ENCOUNTER — Other Ambulatory Visit: Payer: Self-pay

## 2021-12-14 ENCOUNTER — Encounter: Payer: Self-pay | Admitting: Endocrinology

## 2021-12-14 VITALS — Ht 64.0 in | Wt 170.0 lb

## 2021-12-14 DIAGNOSIS — E1165 Type 2 diabetes mellitus with hyperglycemia: Secondary | ICD-10-CM | POA: Diagnosis not present

## 2021-12-14 DIAGNOSIS — N289 Disorder of kidney and ureter, unspecified: Secondary | ICD-10-CM | POA: Diagnosis not present

## 2021-12-14 DIAGNOSIS — K529 Noninfective gastroenteritis and colitis, unspecified: Secondary | ICD-10-CM

## 2021-12-14 DIAGNOSIS — E782 Mixed hyperlipidemia: Secondary | ICD-10-CM

## 2021-12-14 MED ORDER — METFORMIN HCL ER 500 MG PO TB24: ORAL_TABLET | ORAL | 3 refills | Status: DC

## 2021-12-14 NOTE — Addendum Note (Signed)
Addended by: Reather Littler on: 12/14/2021 04:47 PM   Modules accepted: Level of Service

## 2021-12-14 NOTE — Progress Notes (Addendum)
Patient ID: Kristin Olson, female   DOB: [birth year], 66 y.o.   MRN: [id number]  I connected with the above-named patient by video enabled telemedicine application and verified that I am speaking with the correct person. The patient was explained the limitations of evaluation and management by telemedicine and the availability of in person appointments.  Patient also understood that there may be a patient responsible charge related to this service  Location of the patient: Patient's home  Location of the provider: Physician office Only the patient and myself were participating in the encounter The patient understood the above statements and agreed to proceed.   Reason for Appointment: Endocrinology follow-up   History of Present Illness   Diagnosis: Type 2 DIABETES MELITUS, date of diagnosis: 1999      She has had mild diabetes for several years which has been usually well controlled with metformin alone  Current oral hypoglycemic drugs: None  A1c is increasing, now 6.3 % compared to as low as 5.5 previously  Current blood sugars, problems identified and self management: She has been again monitoring her blood sugars fairly regularly either morning or 2 hours after dinner Compared to the last visit her blood sugars are higher both fasting and after meals and lab glucose is 151 fasting Her weight appears to be going up with resolution of her nausea She is avoiding sweet tea and regular soft drinks   Monitors blood glucose:  infrequently     Glucometer: One Touch.     Blood sugars at home by review of monitor on the phone  FASTING range 121-148 After dinner 115-182 AVERAGE for 30 days = 142  Previously:  PRE-MEAL Fasting Lunch Dinner Bedtime Overall  Glucose range: 91-167      Mean/median:     ?   POST-MEAL PC Breakfast PC Lunch PC Dinner  Glucose range:   91-180  Mean/median:         Side effects from medications:?  Nausea from Victoza            Wt  Readings from Last 3 Encounters:  12/14/21 170 lb (77.1 kg)  07/17/21 178 lb (80.7 kg)  04/13/21 162 lb (73.5 kg)     Lab Results  Component Value Date   HGBA1C 6.3 (H) 11/13/2021   HGBA1C 6.0 (H) 07/06/2021   HGBA1C 5.5 03/26/2021   Lab Results  Component Value Date   MICROALBUR 2.4 (H) 03/04/2018   LDLCALC 74 11/13/2021   CREATININE 1.40 (H) 11/13/2021   PRIOR history:  Because of somewhat higher blood sugars and weight gain she was tried on Byetta in 2006 and subsequently Victoza. Previously Victoza had helped her with portion control and weight loss.    However because she had difficulty with nausea when she was taking this it was stopped, also  could not afford this because of insurance noncoverage; she stopped taking this in 2014    OTHER active problems: See review of systems     Allergies as of 12/14/2021       Reactions   Levaquin [levofloxacin In D5w] Itching        Medication List        Accurate as of December 14, 2021 11:27 AM. If you have any questions, ask your nurse or doctor.          albuterol 108 (90 Base) MCG/ACT inhaler Commonly known as: VENTOLIN HFA Inhale into the lungs.   amoxicillin-clavulanate 875-125 MG tablet Commonly known as: AUGMENTIN Take 1  tablet by mouth 2 (two) times daily.   atorvastatin 10 MG tablet Commonly known as: LIPITOR Take 1 tablet (10 mg total) by mouth daily.   cefUROXime 500 MG tablet Commonly known as: CEFTIN cefuroxime axetil 500 mg tablet  TAKE 1 TABLET BY MOUTH TWICE DAILY   colestipol 1 g tablet Commonly known as: COLESTID Take 1 g by mouth 2 (two) times daily.   diazepam 5 MG tablet Commonly known as: VALIUM Take 5 mg by mouth 3 (three) times daily.   gabapentin 400 MG capsule Commonly known as: NEURONTIN Take 400 mg by mouth 2 (two) times daily.   mirtazapine 7.5 MG tablet Commonly known as: REMERON Take 2 mg by mouth at bedtime.   omega-3 acid ethyl esters 1 g capsule Commonly  known as: LOVAZA TAKE 2 CAPSULES BY MOUTH TWICE DAILY   omeprazole 20 MG capsule Commonly known as: PRILOSEC 40 mg.   OneTouch Delica Lancets 81E Misc 1 each by Other route 2 (two) times daily. Use as instructed to check blood sugar twice daily.DX:E11.9   OneTouch Verio Reflect w/Device Kit Use to check blood sugar daily   OneTouch Verio test strip Generic drug: glucose blood USE  STRIP TO CHECK GLUCOSE TWICE DAILY   oxybutynin 15 MG 24 hr tablet Commonly known as: DITROPAN XL Take 15 mg by mouth 2 (two) times daily.   risperiDONE 2 MG tablet Commonly known as: RISPERDAL Take 2 mg by mouth once.   sertraline 100 MG tablet Commonly known as: ZOLOFT Take 100 mg by mouth daily. 2 tablets by mouth daily   sucralfate 1 g tablet Commonly known as: CARAFATE Take 1 g by mouth 4 (four) times daily -  with meals and at bedtime.   SUMAtriptan 100 MG tablet Commonly known as: IMITREX Take 100 mg by mouth every 4 (four) hours as needed for migraine.   trimethoprim 100 MG tablet Commonly known as: TRIMPEX Take 100 mg by mouth 2 (two) times daily.        No past medical history on file.  No past surgical history on file.  Family History  Problem Relation Age of Onset   Diabetes Mother    Diabetes Maternal Grandfather     Social History:  reports that she quit smoking about 23 years ago. Her smoking use included cigarettes. She has never used smokeless tobacco. No history on file for alcohol use and drug use.  Allergies:  Allergies  Allergen Reactions   Levaquin [Levofloxacin In D5w] Itching     Review of systems:  RENAL dysfunction: Creatinine has been variable  She has history of recurrent UTI  Lab Results  Component Value Date   CREATININE 1.40 (H) 11/13/2021   CREATININE 1.34 (H) 07/06/2021   CREATININE 1.53 (H) 03/26/2021     HYPERLIPIDEMIA: She has had high LDL, high triglycerides as also LDL particle number  and low HDL.   Previously her particle  number was excellent at 555  She was told to discontinue her atorvastatin since LDL was only 48 previously LDL still relatively well controlled  She is on Lovaza She is also on Colestid for diarrhea  Previously on fenofibrate  Also has persistently low HDL    Lab Results  Component Value Date   CHOL 129 11/13/2021   CHOL 99 (L) 07/06/2021   CHOL 111 03/26/2021   Lab Results  Component Value Date   HDL 33 (L) 11/13/2021   HDL 33 (L) 07/06/2021   HDL 35 (L) 03/26/2021  Lab Results  Component Value Date   LDLCALC 74 11/13/2021   LDLCALC 48 07/06/2021   LDLCALC 57 03/26/2021   Lab Results  Component Value Date   TRIG 124 11/13/2021   TRIG 94 07/06/2021   TRIG 102 03/26/2021   Lab Results  Component Value Date   CHOLHDL 3.9 11/13/2021   CHOLHDL 3.0 07/06/2021   CHOLHDL 3.2 03/26/2021   No results found for: LDLDIRECT   Lab Results  Component Value Date   ALT 12 07/06/2021     GI: She  had several surgeries because of biliary duct obstruction She has been [facility] at [facility] Apparently not having any regular follow-up for her liver function abnormalities  Lab Results  Component Value Date   ALKPHOS 136 (H) 07/06/2021       Examination:   Ht '5\' 4"'  (1.626 m)    Wt 170 lb (77.1 kg)    BMI 29.18 kg/m   Body mass index is 29.18 kg/m.    Assesment/PLAN:  Diabetes type 2, mild and currently not on treatment  Her A1c is 6.3 and higher than before  With her appetite and nausea being better she is gaining weight and getting more hyperglycemic  Needs to restart metformin because of progressive hyperglycemia She has not had diarrhea from this before and already takes Colestid to control her diarrhea that occurs for other reasons Although she may be a candidate for SGLT2 drugs she appears to be getting recurrent UTIs currently  She will take 500 mg ER for 7 days, increase to 2 tablets in 1 week and then 3 tablets in 2 weeks  Mild increase in creatinine:  Stable, needs to be evaluated and followed by PCP   HYPERLIPIDEMIA: Controlled with triglycerides consistently normal with Lovaza only, this is despite taking Colestid   3 months follow-up with fasting labs and A1c  Elayne Snare 12/14/2021, 11:27 AM

## 2021-12-17 ENCOUNTER — Other Ambulatory Visit: Payer: Self-pay

## 2021-12-17 DIAGNOSIS — E1165 Type 2 diabetes mellitus with hyperglycemia: Secondary | ICD-10-CM

## 2022-01-23 DIAGNOSIS — Z01818 Encounter for other preprocedural examination: Secondary | ICD-10-CM | POA: Diagnosis not present

## 2022-02-03 ENCOUNTER — Encounter: Payer: Self-pay | Admitting: Endocrinology

## 2022-02-04 ENCOUNTER — Other Ambulatory Visit: Payer: Self-pay | Admitting: Endocrinology

## 2022-02-04 MED ORDER — RYBELSUS 3 MG PO TABS: 3.0000 mg | ORAL_TABLET | Freq: Every day | ORAL | 0 refills | Status: DC

## 2022-02-27 ENCOUNTER — Encounter: Payer: Self-pay | Admitting: Endocrinology

## 2022-02-27 DIAGNOSIS — E119 Type 2 diabetes mellitus without complications: Secondary | ICD-10-CM

## 2022-02-28 ENCOUNTER — Other Ambulatory Visit: Payer: Self-pay | Admitting: Endocrinology

## 2022-02-28 MED ORDER — RYBELSUS 7 MG PO TABS: 1.0000 | ORAL_TABLET | Freq: Every day | ORAL | 0 refills | Status: DC

## 2022-03-05 ENCOUNTER — Other Ambulatory Visit (HOSPITAL_COMMUNITY): Payer: Self-pay

## 2022-03-05 ENCOUNTER — Telehealth: Payer: Self-pay | Admitting: Pharmacy Technician

## 2022-03-05 NOTE — Telephone Encounter (Signed)
Patient called in and she will stop by tomorrow to complete application ?

## 2022-03-05 NOTE — Telephone Encounter (Signed)
Pt is in her coverage gap. Copay for Trulicity and Ozempic is over $200. Greggory Keen is not on formulary. ?

## 2022-03-06 ENCOUNTER — Encounter: Payer: Self-pay | Admitting: Endocrinology

## 2022-03-06 NOTE — Telephone Encounter (Signed)
Patient stopped by to complete application. She will send proof of income via mychart ?

## 2022-03-07 MED ORDER — ONETOUCH VERIO VI STRP: ORAL_STRIP | 2 refills | Status: DC

## 2022-03-08 LAB — COMPREHENSIVE METABOLIC PANEL
ALT: 12 IU/L (ref 0–32)
AST: 26 IU/L (ref 0–40)
Albumin/Globulin Ratio: 1.2 (ref 1.2–2.2)
Albumin: 3.7 g/dL — ABNORMAL LOW (ref 3.8–4.8)
Alkaline Phosphatase: 151 IU/L — ABNORMAL HIGH (ref 44–121)
BUN/Creatinine Ratio: 8 — ABNORMAL LOW (ref 12–28)
BUN: 11 mg/dL (ref 8–27)
Bilirubin Total: 0.5 mg/dL (ref 0.0–1.2)
CO2: 21 mmol/L (ref 20–29)
Calcium: 8.7 mg/dL (ref 8.7–10.3)
Chloride: 103 mmol/L (ref 96–106)
Creatinine, Ser: 1.45 mg/dL — ABNORMAL HIGH (ref 0.57–1.00)
Globulin, Total: 3 g/dL (ref 1.5–4.5)
Glucose: 126 mg/dL — ABNORMAL HIGH (ref 70–99)
Potassium: 3.8 mmol/L (ref 3.5–5.2)
Sodium: 139 mmol/L (ref 134–144)
Total Protein: 6.7 g/dL (ref 6.0–8.5)
eGFR: 39 mL/min/{1.73_m2} — ABNORMAL LOW (ref >59)

## 2022-03-08 LAB — HEMOGLOBIN A1C
Est. average glucose Bld gHb Est-mCnc: 120 mg/dL
Hgb A1c MFr Bld: 5.8 % — ABNORMAL HIGH (ref 4.8–5.6)

## 2022-03-15 ENCOUNTER — Telehealth: Payer: Medicare Other | Admitting: Endocrinology

## 2022-03-15 DIAGNOSIS — E782 Mixed hyperlipidemia: Secondary | ICD-10-CM

## 2022-03-15 DIAGNOSIS — E1165 Type 2 diabetes mellitus with hyperglycemia: Secondary | ICD-10-CM | POA: Diagnosis not present

## 2022-03-15 MED ORDER — REPAGLINIDE 0.5 MG PO TABS: 0.5000 mg | ORAL_TABLET | Freq: Three times a day (TID) | ORAL | 1 refills | Status: DC

## 2022-03-15 NOTE — Progress Notes (Signed)
Patient ID: Kristin Olson, female   DOB: 11-15-1955, 66 y.o.   MRN: [id number]  I connected with the above-named patient by video enabled telemedicine application and verified that I am speaking with the correct person. The patient was explained the limitations of evaluation and management by telemedicine and the availability of in person appointments.  Patient also understood that there may be a patient responsible charge related to this service  Location of the patient: Patient's home  Location of the provider: Physician office Only the patient and myself were participating in the encounter The patient understood the above statements and agreed to proceed.   Reason for Appointment: Endocrinology follow-up   History of Present Illness   Diagnosis: Type 2 DIABETES MELITUS, date of diagnosis: 1999      She has had mild diabetes for several years which has been usually well controlled with metformin alone  Current oral hypoglycemic drugs: None  A1c is relatively better at 5.8 compared to 6.3  Current blood sugars, problems identified and self management: She was concerned about her blood sugars being higher but these have been very sporadic and mostly after meals She did try Rybelsus 3 mg daily but could not continue because of cost With 3 mg she did not have any side effects and did not report as many high blood sugars She thinks her weight is probably about 160 and this is improved with Rybelsus  Appetite is fairly good but may be getting more carbohydrates at times  Unable to get many blood sugars on the phone from her meter but fasting blood sugar on the lab was 126 Highest blood sugar at home 330 With various issues she has not been able to exercise  Side effects from medications: Diarrhea from metformin  Monitors blood glucose:  infrequently     Glucometer: One Touch.     Blood sugars at home by review of monitor on the phone   PRE-MEAL Fasting Lunch Dinner  Bedtime Overall  Glucose range: 144 115     Mean/median:     ?   POST-MEAL PC Breakfast PC Lunch PC Dinner  Glucose range:     Mean/median:  109, 330 155     Previously:  PRE-MEAL Fasting Lunch Dinner Bedtime Overall  Glucose range: 91-167      Mean/median:     ?   POST-MEAL PC Breakfast PC Lunch PC Dinner  Glucose range:   91-180  Mean/median:         Side effects from medications:?  Nausea from Victoza            Wt Readings from Last 3 Encounters:  12/14/21 170 lb (77.1 kg)  07/17/21 178 lb (80.7 kg)  04/13/21 162 lb (73.5 kg)     Lab Results  Component Value Date   HGBA1C 5.8 (H) 03/07/2022   HGBA1C 6.3 (H) 11/13/2021   HGBA1C 6.0 (H) 07/06/2021   Lab Results  Component Value Date   MICROALBUR 2.4 (H) 03/04/2018   LDLCALC 74 11/13/2021   CREATININE 1.45 (H) 03/07/2022   PRIOR history:  Because of somewhat higher blood sugars and weight gain she was tried on Byetta in 2006 and subsequently Victoza. Previously Victoza had helped her with portion control and weight loss.    However because she had difficulty with nausea when she was taking this it was stopped, also  could not afford this because of insurance noncoverage; she stopped taking this in 2014    OTHER active problems:  See review of systems     Allergies as of 03/15/2022       Reactions   Levaquin [levofloxacin In D5w] Itching        Medication List        Accurate as of Mar 15, 2022  9:19 AM. If you have any questions, ask your nurse or doctor.          albuterol 108 (90 Base) MCG/ACT inhaler Commonly known as: VENTOLIN HFA Inhale into the lungs.   amoxicillin-clavulanate 875-125 MG tablet Commonly known as: AUGMENTIN Take 1 tablet by mouth 2 (two) times daily.   atorvastatin 10 MG tablet Commonly known as: LIPITOR Take 1 tablet (10 mg total) by mouth daily.   cefUROXime 500 MG tablet Commonly known as: CEFTIN cefuroxime axetil 500 mg tablet  TAKE 1 TABLET BY MOUTH  TWICE DAILY   colestipol 1 g tablet Commonly known as: COLESTID Take 1 g by mouth 2 (two) times daily.   diazepam 5 MG tablet Commonly known as: VALIUM Take 5 mg by mouth 3 (three) times daily.   Dyanavel XR 10 MG Cher Generic drug: Amphetamine ER CHEW AND SWALLOW 1 TABLET BY MOUTH EVERY MORNING   gabapentin 400 MG capsule Commonly known as: NEURONTIN Take 400 mg by mouth 2 (two) times daily.   metFORMIN 500 MG 24 hr tablet Commonly known as: GLUCOPHAGE-XR Take1 tablet or 7 days, increase to 2 tablets in 1 week and then 3 tablets in 2 weeks   mirtazapine 7.5 MG tablet Commonly known as: REMERON Take 2 mg by mouth at bedtime.   omega-3 acid ethyl esters 1 g capsule Commonly known as: LOVAZA TAKE 2 CAPSULES BY MOUTH TWICE DAILY   omeprazole 20 MG capsule Commonly known as: PRILOSEC 40 mg.   OneTouch Delica Lancets 72I Misc 1 each by Other route 2 (two) times daily. Use as instructed to check blood sugar twice daily.DX:E11.9   OneTouch Verio Reflect w/Device Kit Use to check blood sugar daily   OneTouch Verio test strip Generic drug: glucose blood USE  STRIP TO CHECK GLUCOSE TWICE DAILY   oxybutynin 15 MG 24 hr tablet Commonly known as: DITROPAN XL Take 15 mg by mouth 2 (two) times daily.   risperiDONE 2 MG tablet Commonly known as: RISPERDAL Take 2 mg by mouth once.   Rybelsus 7 MG Tabs Generic drug: Semaglutide Take 1 tablet by mouth daily before breakfast. Take 30 minutes before breakfast with 4 oz. water   sertraline 100 MG tablet Commonly known as: ZOLOFT Take 100 mg by mouth daily. 2 tablets by mouth daily   sucralfate 1 g tablet Commonly known as: CARAFATE Take 1 g by mouth 4 (four) times daily -  with meals and at bedtime.   SUMAtriptan 100 MG tablet Commonly known as: IMITREX Take 100 mg by mouth every 4 (four) hours as needed for migraine.   trimethoprim 100 MG tablet Commonly known as: TRIMPEX Take 100 mg by mouth 2 (two) times daily.         No past medical history on file.  No past surgical history on file.  Family History  Problem Relation Age of Onset   Diabetes Mother    Diabetes Maternal Grandfather     Social History:  reports that she quit smoking about 23 years ago. Her smoking use included cigarettes. She has never used smokeless tobacco. No history on file for alcohol use and drug use.  Allergies:  Allergies  Allergen Reactions   Levaquin [  Levofloxacin In D5w] Itching     Review of systems:  RENAL dysfunction: Creatinine has been variable, slightly higher on this visit  She has history of recurrent UTI  Lab Results  Component Value Date   CREATININE 1.45 (H) 03/07/2022   CREATININE 1.40 (H) 11/13/2021   CREATININE 1.34 (H) 07/06/2021   IRON DEFICIENCY: Recent hemoglobin 10.3 with low iron saturation followed by PCP  HYPERLIPIDEMIA: She has had high LDL, high triglycerides as also LDL particle number  and low HDL.   Previously her particle number was excellent at 555  She was told to discontinue her atorvastatin since LDL was only 48 previously LDL still relatively well controlled  She is on Lovaza She is also on Colestid for diarrhea  Previously on fenofibrate  Also has persistently low HDL  Lab Results  Component Value Date   CHOL 129 11/13/2021   CHOL 99 (L) 07/06/2021   CHOL 111 03/26/2021   Lab Results  Component Value Date   HDL 33 (L) 11/13/2021   HDL 33 (L) 07/06/2021   HDL 35 (L) 03/26/2021   Lab Results  Component Value Date   LDLCALC 74 11/13/2021   LDLCALC 48 07/06/2021   LDLCALC 57 03/26/2021   Lab Results  Component Value Date   TRIG 124 11/13/2021   TRIG 94 07/06/2021   TRIG 102 03/26/2021   Lab Results  Component Value Date   CHOLHDL 3.9 11/13/2021   CHOLHDL 3.0 07/06/2021   CHOLHDL 3.2 03/26/2021   No results found for: LDLDIRECT   Lab Results  Component Value Date   ALT 12 03/07/2022     GI: She  had several surgeries because of  biliary duct obstruction She has been followed at [facility] Apparently not having any regular follow-up for her liver function abnormalities  Lab Results  Component Value Date   ALKPHOS 151 (H) 03/07/2022       Examination:   There were no vitals taken for this visit.  There is no height or weight on file to calculate BMI.    Assesment/PLAN:  Diabetes type 2, mild and currently not on treatment  Her A1c is 5.8 and better Not clear if this is completely accurate because of recent anemia with hemoglobin 10.6  Again not on medications either because of side effects from metformin or inability to afford GLP-1 drugs SGLT2 drugs likely contraindicated because of frequent UTIs  She has sporadic high readings after meals but not consistently high in the morning as lab glucose was 126 fasting  She is likely a good candidate for Prandin for now and can start with 0.5 mg with complete meals and 2 tablets of eating more carbohydrate or finding certain meals to be still causing readings over 180, discussed how this works   Mild increase in creatinine: Stable, needs to be evaluated and followed by PCP    3 months follow-up with fasting labs and A1c  Elayne Snare 03/15/2022, 9:19 AM

## 2022-03-19 ENCOUNTER — Other Ambulatory Visit: Payer: Self-pay | Admitting: Endocrinology

## 2022-04-21 DIAGNOSIS — N3091 Cystitis, unspecified with hematuria: Secondary | ICD-10-CM | POA: Diagnosis not present

## 2022-04-21 DIAGNOSIS — R300 Dysuria: Secondary | ICD-10-CM | POA: Diagnosis not present

## 2022-04-22 DIAGNOSIS — G4733 Obstructive sleep apnea (adult) (pediatric): Secondary | ICD-10-CM | POA: Diagnosis not present

## 2022-05-07 DIAGNOSIS — D649 Anemia, unspecified: Secondary | ICD-10-CM | POA: Diagnosis not present

## 2022-05-13 ENCOUNTER — Other Ambulatory Visit: Payer: Self-pay

## 2022-05-13 DIAGNOSIS — F331 Major depressive disorder, recurrent, moderate: Secondary | ICD-10-CM | POA: Diagnosis not present

## 2022-05-13 DIAGNOSIS — E1165 Type 2 diabetes mellitus with hyperglycemia: Secondary | ICD-10-CM

## 2022-05-13 MED ORDER — REPAGLINIDE 0.5 MG PO TABS: 0.5000 mg | ORAL_TABLET | Freq: Three times a day (TID) | ORAL | 1 refills | Status: DC

## 2022-05-14 ENCOUNTER — Other Ambulatory Visit: Payer: Self-pay | Admitting: Endocrinology

## 2022-05-14 DIAGNOSIS — Z6829 Body mass index (BMI) 29.0-29.9, adult: Secondary | ICD-10-CM | POA: Diagnosis not present

## 2022-05-14 DIAGNOSIS — E1165 Type 2 diabetes mellitus with hyperglycemia: Secondary | ICD-10-CM

## 2022-05-14 DIAGNOSIS — M5106 Intervertebral disc disorders with myelopathy, lumbar region: Secondary | ICD-10-CM | POA: Diagnosis not present

## 2022-05-14 DIAGNOSIS — Z79899 Other long term (current) drug therapy: Secondary | ICD-10-CM | POA: Diagnosis not present

## 2022-05-17 DIAGNOSIS — N3090 Cystitis, unspecified without hematuria: Secondary | ICD-10-CM | POA: Diagnosis not present

## 2022-05-17 DIAGNOSIS — R300 Dysuria: Secondary | ICD-10-CM | POA: Diagnosis not present

## 2022-05-17 DIAGNOSIS — N111 Chronic obstructive pyelonephritis: Secondary | ICD-10-CM | POA: Diagnosis not present

## 2022-05-20 ENCOUNTER — Other Ambulatory Visit: Payer: Self-pay

## 2022-05-20 DIAGNOSIS — N390 Urinary tract infection, site not specified: Secondary | ICD-10-CM | POA: Diagnosis not present

## 2022-05-20 DIAGNOSIS — E860 Dehydration: Secondary | ICD-10-CM | POA: Diagnosis not present

## 2022-05-30 DIAGNOSIS — M47816 Spondylosis without myelopathy or radiculopathy, lumbar region: Secondary | ICD-10-CM | POA: Diagnosis not present

## 2022-05-30 DIAGNOSIS — M5126 Other intervertebral disc displacement, lumbar region: Secondary | ICD-10-CM | POA: Diagnosis not present

## 2022-05-30 DIAGNOSIS — M5416 Radiculopathy, lumbar region: Secondary | ICD-10-CM | POA: Diagnosis not present

## 2022-05-30 DIAGNOSIS — M5106 Intervertebral disc disorders with myelopathy, lumbar region: Secondary | ICD-10-CM | POA: Diagnosis not present

## 2022-05-30 DIAGNOSIS — M48061 Spinal stenosis, lumbar region without neurogenic claudication: Secondary | ICD-10-CM | POA: Diagnosis not present

## 2022-05-30 DIAGNOSIS — Q7649 Other congenital malformations of spine, not associated with scoliosis: Secondary | ICD-10-CM | POA: Diagnosis not present

## 2022-05-31 DIAGNOSIS — Z961 Presence of intraocular lens: Secondary | ICD-10-CM | POA: Diagnosis not present

## 2022-05-31 DIAGNOSIS — E119 Type 2 diabetes mellitus without complications: Secondary | ICD-10-CM | POA: Diagnosis not present

## 2022-05-31 DIAGNOSIS — H353131 Nonexudative age-related macular degeneration, bilateral, early dry stage: Secondary | ICD-10-CM | POA: Diagnosis not present

## 2022-05-31 LAB — HM DIABETES EYE EXAM

## 2022-06-07 ENCOUNTER — Encounter: Payer: Self-pay | Admitting: Endocrinology

## 2022-06-07 DIAGNOSIS — E1165 Type 2 diabetes mellitus with hyperglycemia: Secondary | ICD-10-CM

## 2022-06-11 MED ORDER — REPAGLINIDE 0.5 MG PO TABS: 0.5000 mg | ORAL_TABLET | Freq: Three times a day (TID) | ORAL | 1 refills | Status: DC

## 2022-06-12 ENCOUNTER — Encounter: Payer: Self-pay | Admitting: Endocrinology

## 2022-06-12 DIAGNOSIS — F331 Major depressive disorder, recurrent, moderate: Secondary | ICD-10-CM | POA: Diagnosis not present

## 2022-06-13 DIAGNOSIS — M47896 Other spondylosis, lumbar region: Secondary | ICD-10-CM | POA: Diagnosis not present

## 2022-06-13 DIAGNOSIS — M5416 Radiculopathy, lumbar region: Secondary | ICD-10-CM | POA: Diagnosis not present

## 2022-06-16 DIAGNOSIS — M25511 Pain in right shoulder: Secondary | ICD-10-CM | POA: Diagnosis not present

## 2022-06-16 DIAGNOSIS — N390 Urinary tract infection, site not specified: Secondary | ICD-10-CM | POA: Diagnosis not present

## 2022-06-16 DIAGNOSIS — R300 Dysuria: Secondary | ICD-10-CM | POA: Diagnosis not present

## 2022-06-19 DIAGNOSIS — M1711 Unilateral primary osteoarthritis, right knee: Secondary | ICD-10-CM | POA: Diagnosis not present

## 2022-06-20 DIAGNOSIS — N289 Disorder of kidney and ureter, unspecified: Secondary | ICD-10-CM | POA: Diagnosis not present

## 2022-06-20 DIAGNOSIS — E782 Mixed hyperlipidemia: Secondary | ICD-10-CM | POA: Diagnosis not present

## 2022-06-20 DIAGNOSIS — E119 Type 2 diabetes mellitus without complications: Secondary | ICD-10-CM | POA: Diagnosis not present

## 2022-06-21 LAB — LIPID PANEL
Chol/HDL Ratio: 4.4 ratio (ref 0.0–4.4)
Cholesterol, Total: 145 mg/dL (ref 100–199)
HDL: 33 mg/dL — ABNORMAL LOW (ref >39)
LDL Chol Calc (NIH): 93 mg/dL (ref 0–99)
Triglycerides: 100 mg/dL (ref 0–149)
VLDL Cholesterol Cal: 19 mg/dL (ref 5–40)

## 2022-06-21 LAB — COMPREHENSIVE METABOLIC PANEL
ALT: 18 IU/L (ref 0–32)
AST: 30 IU/L (ref 0–40)
Albumin/Globulin Ratio: 1.4 (ref 1.2–2.2)
Albumin: 4 g/dL (ref 3.9–4.9)
Alkaline Phosphatase: 166 IU/L — ABNORMAL HIGH (ref 44–121)
BUN/Creatinine Ratio: 13 (ref 12–28)
BUN: 20 mg/dL (ref 8–27)
Bilirubin Total: 0.2 mg/dL (ref 0.0–1.2)
CO2: 16 mmol/L — ABNORMAL LOW (ref 20–29)
Calcium: 9.2 mg/dL (ref 8.7–10.3)
Chloride: 107 mmol/L — ABNORMAL HIGH (ref 96–106)
Creatinine, Ser: 1.57 mg/dL — ABNORMAL HIGH (ref 0.57–1.00)
Globulin, Total: 2.9 g/dL (ref 1.5–4.5)
Glucose: 213 mg/dL — ABNORMAL HIGH (ref 70–99)
Potassium: 5 mmol/L (ref 3.5–5.2)
Sodium: 141 mmol/L (ref 134–144)
Total Protein: 6.9 g/dL (ref 6.0–8.5)
eGFR: 36 mL/min/{1.73_m2} — ABNORMAL LOW (ref >59)

## 2022-06-21 LAB — HEMOGLOBIN A1C
Est. average glucose Bld gHb Est-mCnc: 128 mg/dL
Hgb A1c MFr Bld: 6.1 % — ABNORMAL HIGH (ref 4.8–5.6)

## 2022-06-21 LAB — MICROALBUMIN / CREATININE URINE RATIO
Creatinine, Urine: 89.3 mg/dL
Microalb/Creat Ratio: 4 mg/g creat (ref 0–29)
Microalbumin, Urine: 3.8 ug/mL

## 2022-07-05 ENCOUNTER — Telehealth: Payer: Self-pay

## 2022-07-05 ENCOUNTER — Telehealth (INDEPENDENT_AMBULATORY_CARE_PROVIDER_SITE_OTHER): Payer: Medicare Other | Admitting: Endocrinology

## 2022-07-05 DIAGNOSIS — E669 Obesity, unspecified: Secondary | ICD-10-CM

## 2022-07-05 DIAGNOSIS — E1169 Type 2 diabetes mellitus with other specified complication: Secondary | ICD-10-CM

## 2022-07-05 DIAGNOSIS — E782 Mixed hyperlipidemia: Secondary | ICD-10-CM

## 2022-07-05 DIAGNOSIS — E1165 Type 2 diabetes mellitus with hyperglycemia: Secondary | ICD-10-CM

## 2022-07-05 MED ORDER — METFORMIN HCL ER 500 MG PO TB24: ORAL_TABLET | ORAL | 3 refills | Status: DC

## 2022-07-05 NOTE — Progress Notes (Signed)
Patient ID: Kristin Olson, female   DOB: 1955-11-16, 66 y.o.   MRN: [id number]  I connected with the above-named patient by video enabled telemedicine application and verified that I am speaking with the correct person. The patient was explained the limitations of evaluation and management by telemedicine and the availability of in person appointments.  Patient also understood that there may be a patient responsible charge related to this service  Location of the patient: Patient's home  Location of the provider: Physician office Only the patient and myself were participating in the encounter The patient understood the above statements and agreed to proceed.   Reason for Appointment: Endocrinology follow-up   History of Present Illness   Diagnosis: Type 2 DIABETES MELITUS, date of diagnosis: 1999      She has had mild diabetes for several years which has been usually well controlled with metformin alone  Current oral hypoglycemic drugs:   A1c is compared to 5.8  Current blood sugars, problems identified and self management: She was not able to use Rybelsus because of high out-of-pocket expense Also she says that she has diarrhea from higher doses of metformin With starting Prandin on her last visit her blood sugars are better at times but still inconsistent Fasting blood sugars have been as low as 104 but lab glucose was 213 Also postprandial readings are fluctuating but checked mostly before lunch and dinner  She now says that she is only eating fruit like cantaloupe at breakfast and will get low normal readings in the 60s occasionally at lunch or dinner  Again she has not been able to exercise She thinks her weight is about the same as before  Side effects from medications: Diarrhea from metformin  Monitors blood glucose:  infrequently     Glucometer: One Touch.     Blood sugars at home by review of monitor by patient   PRE-MEAL Fasting Lunch Dinner Bedtime  Overall  Glucose range: 104-239 66-280 64-104    Mean/median:     153   POST-MEAL PC Breakfast PC Lunch PC Dinner  Glucose range:   ?  Mean/median:       Previously:  PRE-MEAL Fasting Lunch Dinner Bedtime Overall  Glucose range: 144 115     Mean/median:     ?   POST-MEAL PC Breakfast PC Lunch PC Dinner  Glucose range:     Mean/median:  109, 330 155       Side effects from medications:?  Nausea from Victoza            Wt Readings from Last 3 Encounters:  12/14/21 170 lb (77.1 kg)  07/17/21 178 lb (80.7 kg)  04/13/21 162 lb (73.5 kg)     Lab Results  Component Value Date   HGBA1C 6.1 (H) 06/20/2022   HGBA1C 5.8 (H) 03/07/2022   HGBA1C 6.3 (H) 11/13/2021   Lab Results  Component Value Date   MICROALBUR 2.4 (H) 03/04/2018   LDLCALC 93 06/20/2022   CREATININE 1.57 (H) 06/20/2022   PRIOR history:  Because of somewhat higher blood sugars and weight gain she was tried on Byetta in 2006 and subsequently Victoza. Previously Victoza had helped her with portion control and weight loss.    However because she had difficulty with nausea when she was taking this it was stopped, also  could not afford this because of insurance noncoverage; she stopped taking this in 2014    OTHER active problems: See review of systems  Allergies as of 07/05/2022       Reactions   Levaquin [levofloxacin In D5w] Itching        Medication List        Accurate as of July 05, 2022  8:32 AM. If you have any questions, ask your nurse or doctor.          albuterol 108 (90 Base) MCG/ACT inhaler Commonly known as: VENTOLIN HFA Inhale into the lungs.   amoxicillin-clavulanate 875-125 MG tablet Commonly known as: AUGMENTIN Take 1 tablet by mouth 2 (two) times daily.   atorvastatin 10 MG tablet Commonly known as: LIPITOR Take 1 tablet (10 mg total) by mouth daily.   cefUROXime 500 MG tablet Commonly known as: CEFTIN cefuroxime axetil 500 mg tablet  TAKE 1 TABLET BY  MOUTH TWICE DAILY   colestipol 1 g tablet Commonly known as: COLESTID Take 1 g by mouth 2 (two) times daily.   diazepam 5 MG tablet Commonly known as: VALIUM Take 5 mg by mouth 3 (three) times daily.   Dyanavel XR 10 MG Cher Generic drug: Amphetamine ER CHEW AND SWALLOW 1 TABLET BY MOUTH EVERY MORNING   gabapentin 400 MG capsule Commonly known as: NEURONTIN Take 400 mg by mouth 2 (two) times daily.   metFORMIN 500 MG 24 hr tablet Commonly known as: GLUCOPHAGE-XR Take1 tablet or 7 days, increase to 2 tablets in 1 week and then 3 tablets in 2 weeks   mirtazapine 7.5 MG tablet Commonly known as: REMERON Take 2 mg by mouth at bedtime.   omega-3 acid ethyl esters 1 g capsule Commonly known as: LOVAZA TAKE 2 CAPSULES BY MOUTH TWICE DAILY   omeprazole 20 MG capsule Commonly known as: PRILOSEC 40 mg.   OneTouch Delica Lancets 61P Misc 1 each by Other route 2 (two) times daily. Use as instructed to check blood sugar twice daily.DX:E11.9   OneTouch Verio Reflect w/Device Kit Use to check blood sugar daily   OneTouch Verio test strip Generic drug: glucose blood USE  STRIP TO CHECK GLUCOSE TWICE DAILY   oxybutynin 15 MG 24 hr tablet Commonly known as: DITROPAN XL Take 15 mg by mouth 2 (two) times daily.   repaglinide 0.5 MG tablet Commonly known as: PRANDIN Take 1 tablet (0.5 mg total) by mouth 3 (three) times daily before meals. Adjust dose based on meal size   risperiDONE 2 MG tablet Commonly known as: RISPERDAL Take 2 mg by mouth once.   Rybelsus 7 MG Tabs Generic drug: Semaglutide Take 1 tablet by mouth daily before breakfast. Take 30 minutes before breakfast with 4 oz. water   sertraline 100 MG tablet Commonly known as: ZOLOFT Take 100 mg by mouth daily. 2 tablets by mouth daily   sucralfate 1 g tablet Commonly known as: CARAFATE Take 1 g by mouth 4 (four) times daily -  with meals and at bedtime.   SUMAtriptan 100 MG tablet Commonly known as:  IMITREX Take 100 mg by mouth every 4 (four) hours as needed for migraine.   trimethoprim 100 MG tablet Commonly known as: TRIMPEX Take 100 mg by mouth 2 (two) times daily.        No past medical history on file.  No past surgical history on file.  Family History  Problem Relation Age of Onset   Diabetes Mother    Diabetes Maternal Grandfather     Social History:  reports that she quit smoking about 23 years ago. Her smoking use included cigarettes. She has never used  smokeless tobacco. No history on file for alcohol use and drug use.  Allergies:  Allergies  Allergen Reactions   Levaquin [Levofloxacin In D5w] Itching     Review of systems:  RENAL dysfunction: Creatinine has been variable, slightly higher on this visit  She has history of recurrent UTI  Lab Results  Component Value Date   CREATININE 1.57 (H) 06/20/2022   CREATININE 1.45 (H) 03/07/2022   CREATININE 1.40 (H) 11/13/2021   IRON DEFICIENCY: Recent hemoglobin 10.3 with low iron saturation followed by PCP  HYPERLIPIDEMIA: She has had high LDL, high triglycerides as also LDL particle number  and low HDL.   Previously her particle number was excellent at 555  She was told to discontinue her atorvastatin since LDL was only 48 previously LDL still relatively well controlled  She is on Lovaza She is also on Colestid for diarrhea  Previously on fenofibrate  Also has persistently low HDL  Lab Results  Component Value Date   CHOL 145 06/20/2022   CHOL 129 11/13/2021   CHOL 99 (L) 07/06/2021   Lab Results  Component Value Date   HDL 33 (L) 06/20/2022   HDL 33 (L) 11/13/2021   HDL 33 (L) 07/06/2021   Lab Results  Component Value Date   LDLCALC 93 06/20/2022   LDLCALC 74 11/13/2021   LDLCALC 48 07/06/2021   Lab Results  Component Value Date   TRIG 100 06/20/2022   TRIG 124 11/13/2021   TRIG 94 07/06/2021   Lab Results  Component Value Date   CHOLHDL 4.4 06/20/2022   CHOLHDL 3.9 11/13/2021    CHOLHDL 3.0 07/06/2021   No results found for: "LDLDIRECT"   Lab Results  Component Value Date   ALT 18 06/20/2022     GI: She  had several surgeries because of biliary duct obstruction She has been followed at [facility]   Lab Results  Component Value Date   ALKPHOS 166 (H) 06/20/2022       Examination:   There were no vitals taken for this visit.  There is no height or weight on file to calculate BMI.    Assesment/PLAN:  Diabetes type 2, mild and currently only on 0.5 mg Prandin at meals  Her A1c is 6.1 but her blood sugars are averaging 153 at home and lab fasting glucose 213 Blood sugar monitoring has been inadequate Also sometimes just eating only fruit for breakfast and no balanced meals  Not clear if this is completely accurate because of recent anemia with hemoglobin 10.6  She cannot afford GLP-1 drugs SGLT2 drugs likely contraindicated because of frequent UTIs For now we will give her a trial of low-dose metformin starting with 500 and going up to 1000 mg   Renal function: About the same No microalbuminuria  LIPIDS: Well-controlled except low HDL  She will need an office visit on the next visit 3 months follow-up with fasting labs and A1c  Elayne Snare 07/05/2022, 8:32 AM

## 2022-07-05 NOTE — Telephone Encounter (Signed)
Spoke with patient about [employer] application for ozempic. Informed her that [employer] states that the application would need to be completed again. Its been more than 90 days. I called to let patient know. She will download application complete it and mail in to office w/ most recent back statement showing SSA deposit. Gave patient office address.

## 2022-07-09 DIAGNOSIS — F331 Major depressive disorder, recurrent, moderate: Secondary | ICD-10-CM | POA: Diagnosis not present

## 2022-07-18 DIAGNOSIS — K31819 Angiodysplasia of stomach and duodenum without bleeding: Secondary | ICD-10-CM | POA: Diagnosis not present

## 2022-07-18 DIAGNOSIS — H353131 Nonexudative age-related macular degeneration, bilateral, early dry stage: Secondary | ICD-10-CM | POA: Diagnosis not present

## 2022-07-18 DIAGNOSIS — K921 Melena: Secondary | ICD-10-CM | POA: Diagnosis not present

## 2022-07-18 DIAGNOSIS — K5520 Angiodysplasia of colon without hemorrhage: Secondary | ICD-10-CM | POA: Diagnosis not present

## 2022-07-18 DIAGNOSIS — E119 Type 2 diabetes mellitus without complications: Secondary | ICD-10-CM | POA: Diagnosis not present

## 2022-07-18 DIAGNOSIS — D649 Anemia, unspecified: Secondary | ICD-10-CM | POA: Diagnosis not present

## 2022-07-19 DIAGNOSIS — N3941 Urge incontinence: Secondary | ICD-10-CM | POA: Diagnosis not present

## 2022-07-19 DIAGNOSIS — N3020 Other chronic cystitis without hematuria: Secondary | ICD-10-CM | POA: Diagnosis not present

## 2022-07-25 DIAGNOSIS — L84 Corns and callosities: Secondary | ICD-10-CM | POA: Diagnosis not present

## 2022-07-25 DIAGNOSIS — Z23 Encounter for immunization: Secondary | ICD-10-CM | POA: Diagnosis not present

## 2022-07-25 DIAGNOSIS — Z7984 Long term (current) use of oral hypoglycemic drugs: Secondary | ICD-10-CM | POA: Diagnosis not present

## 2022-07-25 DIAGNOSIS — M21612 Bunion of left foot: Secondary | ICD-10-CM | POA: Diagnosis not present

## 2022-07-25 DIAGNOSIS — E119 Type 2 diabetes mellitus without complications: Secondary | ICD-10-CM | POA: Diagnosis not present

## 2022-07-29 NOTE — Telephone Encounter (Signed)
Patient called in about application for ozempic. States her insurance states they will cover so she doesn't want to proceed with application. Application and income verification shredded. Please informed on what dose of ozempic you would like patient on. Also, do you want her to continue metformin?

## 2022-07-30 DIAGNOSIS — H353131 Nonexudative age-related macular degeneration, bilateral, early dry stage: Secondary | ICD-10-CM | POA: Diagnosis not present

## 2022-07-30 MED ORDER — SEMAGLUTIDE(0.25 OR 0.5MG/DOS) 2 MG/3ML ~~LOC~~ SOPN: PEN_INJECTOR | SUBCUTANEOUS | 0 refills | Status: DC

## 2022-07-30 NOTE — Addendum Note (Signed)
Addended by: Cinda Quest on: 07/30/2022 04:46 PM   Modules accepted: Orders

## 2022-07-30 NOTE — Telephone Encounter (Signed)
Rx sent and patient notified.

## 2022-08-05 DIAGNOSIS — G4733 Obstructive sleep apnea (adult) (pediatric): Secondary | ICD-10-CM | POA: Diagnosis not present

## 2022-08-05 DIAGNOSIS — M5416 Radiculopathy, lumbar region: Secondary | ICD-10-CM | POA: Diagnosis not present

## 2022-08-06 DIAGNOSIS — F331 Major depressive disorder, recurrent, moderate: Secondary | ICD-10-CM | POA: Diagnosis not present

## 2022-08-08 DIAGNOSIS — M21612 Bunion of left foot: Secondary | ICD-10-CM | POA: Diagnosis not present

## 2022-08-08 DIAGNOSIS — L84 Corns and callosities: Secondary | ICD-10-CM | POA: Diagnosis not present

## 2022-08-08 DIAGNOSIS — E119 Type 2 diabetes mellitus without complications: Secondary | ICD-10-CM | POA: Diagnosis not present

## 2022-08-13 DIAGNOSIS — Z1231 Encounter for screening mammogram for malignant neoplasm of breast: Secondary | ICD-10-CM | POA: Diagnosis not present

## 2022-08-15 ENCOUNTER — Encounter: Payer: Self-pay | Admitting: Endocrinology

## 2022-08-20 ENCOUNTER — Other Ambulatory Visit: Payer: Self-pay

## 2022-08-20 DIAGNOSIS — E1169 Type 2 diabetes mellitus with other specified complication: Secondary | ICD-10-CM

## 2022-08-20 MED ORDER — METFORMIN HCL ER 500 MG PO TB24: ORAL_TABLET | ORAL | 3 refills | Status: DC

## 2022-08-21 DIAGNOSIS — R109 Unspecified abdominal pain: Secondary | ICD-10-CM | POA: Diagnosis not present

## 2022-08-21 DIAGNOSIS — R0781 Pleurodynia: Secondary | ICD-10-CM | POA: Diagnosis not present

## 2022-08-21 DIAGNOSIS — R1012 Left upper quadrant pain: Secondary | ICD-10-CM | POA: Diagnosis not present

## 2022-08-28 ENCOUNTER — Encounter: Payer: Self-pay | Admitting: Endocrinology

## 2022-08-30 DIAGNOSIS — E782 Mixed hyperlipidemia: Secondary | ICD-10-CM | POA: Diagnosis not present

## 2022-08-30 DIAGNOSIS — R0609 Other forms of dyspnea: Secondary | ICD-10-CM | POA: Diagnosis not present

## 2022-08-30 DIAGNOSIS — Z87891 Personal history of nicotine dependence: Secondary | ICD-10-CM | POA: Diagnosis not present

## 2022-08-30 DIAGNOSIS — R002 Palpitations: Secondary | ICD-10-CM | POA: Diagnosis not present

## 2022-09-13 DIAGNOSIS — R1013 Epigastric pain: Secondary | ICD-10-CM | POA: Diagnosis not present

## 2022-09-13 DIAGNOSIS — K831 Obstruction of bile duct: Secondary | ICD-10-CM | POA: Diagnosis not present

## 2022-09-16 DIAGNOSIS — M5416 Radiculopathy, lumbar region: Secondary | ICD-10-CM | POA: Diagnosis not present

## 2022-09-16 DIAGNOSIS — M5106 Intervertebral disc disorders with myelopathy, lumbar region: Secondary | ICD-10-CM | POA: Diagnosis not present

## 2022-09-20 DIAGNOSIS — S8261XA Displaced fracture of lateral malleolus of right fibula, initial encounter for closed fracture: Secondary | ICD-10-CM | POA: Diagnosis not present

## 2022-09-24 DIAGNOSIS — K6389 Other specified diseases of intestine: Secondary | ICD-10-CM | POA: Diagnosis not present

## 2022-09-24 DIAGNOSIS — K766 Portal hypertension: Secondary | ICD-10-CM | POA: Diagnosis not present

## 2022-09-24 DIAGNOSIS — R1012 Left upper quadrant pain: Secondary | ICD-10-CM | POA: Diagnosis not present

## 2022-09-24 DIAGNOSIS — K838 Other specified diseases of biliary tract: Secondary | ICD-10-CM | POA: Diagnosis not present

## 2022-09-24 DIAGNOSIS — I700 Atherosclerosis of aorta: Secondary | ICD-10-CM | POA: Diagnosis not present

## 2022-09-24 DIAGNOSIS — K7290 Hepatic failure, unspecified without coma: Secondary | ICD-10-CM | POA: Diagnosis not present

## 2022-09-26 DIAGNOSIS — K9089 Other intestinal malabsorption: Secondary | ICD-10-CM | POA: Diagnosis not present

## 2022-09-26 DIAGNOSIS — K219 Gastro-esophageal reflux disease without esophagitis: Secondary | ICD-10-CM | POA: Diagnosis not present

## 2022-09-26 DIAGNOSIS — K626 Ulcer of anus and rectum: Secondary | ICD-10-CM | POA: Diagnosis not present

## 2022-09-26 DIAGNOSIS — R109 Unspecified abdominal pain: Secondary | ICD-10-CM | POA: Diagnosis not present

## 2022-10-01 DIAGNOSIS — F331 Major depressive disorder, recurrent, moderate: Secondary | ICD-10-CM | POA: Diagnosis not present

## 2022-10-04 DIAGNOSIS — S8261XA Displaced fracture of lateral malleolus of right fibula, initial encounter for closed fracture: Secondary | ICD-10-CM | POA: Diagnosis not present

## 2022-10-08 DIAGNOSIS — L84 Corns and callosities: Secondary | ICD-10-CM | POA: Diagnosis not present

## 2022-10-08 DIAGNOSIS — Z7984 Long term (current) use of oral hypoglycemic drugs: Secondary | ICD-10-CM | POA: Diagnosis not present

## 2022-10-08 DIAGNOSIS — E119 Type 2 diabetes mellitus without complications: Secondary | ICD-10-CM | POA: Diagnosis not present

## 2022-10-08 DIAGNOSIS — Z7985 Long-term (current) use of injectable non-insulin antidiabetic drugs: Secondary | ICD-10-CM | POA: Diagnosis not present

## 2022-10-11 DIAGNOSIS — N3941 Urge incontinence: Secondary | ICD-10-CM | POA: Diagnosis not present

## 2022-10-11 DIAGNOSIS — N3281 Overactive bladder: Secondary | ICD-10-CM | POA: Diagnosis not present

## 2022-10-22 ENCOUNTER — Other Ambulatory Visit: Payer: Self-pay

## 2022-10-22 ENCOUNTER — Other Ambulatory Visit: Payer: Self-pay | Admitting: Endocrinology

## 2022-10-22 DIAGNOSIS — E1169 Type 2 diabetes mellitus with other specified complication: Secondary | ICD-10-CM | POA: Diagnosis not present

## 2022-10-22 DIAGNOSIS — E669 Obesity, unspecified: Secondary | ICD-10-CM | POA: Diagnosis not present

## 2022-10-23 LAB — BASIC METABOLIC PANEL
BUN/Creatinine Ratio: 8 — ABNORMAL LOW (ref 12–28)
BUN: 9 mg/dL (ref 8–27)
CO2: 20 mmol/L (ref 20–29)
Calcium: 8.4 mg/dL — ABNORMAL LOW (ref 8.7–10.3)
Chloride: 102 mmol/L (ref 96–106)
Creatinine, Ser: 1.12 mg/dL — ABNORMAL HIGH (ref 0.57–1.00)
Glucose: 129 mg/dL — ABNORMAL HIGH (ref 70–99)
Potassium: 3.5 mmol/L (ref 3.5–5.2)
Sodium: 140 mmol/L (ref 134–144)
eGFR: 54 mL/min/{1.73_m2} — ABNORMAL LOW (ref >59)

## 2022-10-23 LAB — HEMOGLOBIN A1C
Est. average glucose Bld gHb Est-mCnc: 123 mg/dL
Hgb A1c MFr Bld: 5.9 % — ABNORMAL HIGH (ref 4.8–5.6)

## 2022-10-24 DIAGNOSIS — R059 Cough, unspecified: Secondary | ICD-10-CM | POA: Diagnosis not present

## 2022-10-24 DIAGNOSIS — U071 COVID-19: Secondary | ICD-10-CM | POA: Diagnosis not present

## 2022-10-30 DIAGNOSIS — K9089 Other intestinal malabsorption: Secondary | ICD-10-CM | POA: Diagnosis not present

## 2022-10-30 DIAGNOSIS — K31819 Angiodysplasia of stomach and duodenum without bleeding: Secondary | ICD-10-CM | POA: Diagnosis not present

## 2022-10-30 DIAGNOSIS — K5520 Angiodysplasia of colon without hemorrhage: Secondary | ICD-10-CM | POA: Diagnosis not present

## 2022-10-30 DIAGNOSIS — K626 Ulcer of anus and rectum: Secondary | ICD-10-CM | POA: Diagnosis not present

## 2022-10-31 ENCOUNTER — Encounter: Payer: Self-pay | Admitting: Endocrinology

## 2022-10-31 ENCOUNTER — Ambulatory Visit: Payer: Medicare Other | Admitting: Endocrinology

## 2022-10-31 VITALS — BP 132/70 | HR 94 | Ht 64.0 in | Wt 176.2 lb

## 2022-10-31 DIAGNOSIS — E1165 Type 2 diabetes mellitus with hyperglycemia: Secondary | ICD-10-CM

## 2022-10-31 DIAGNOSIS — E782 Mixed hyperlipidemia: Secondary | ICD-10-CM

## 2022-10-31 MED ORDER — REPAGLINIDE 0.5 MG PO TABS: 0.5000 mg | ORAL_TABLET | Freq: Three times a day (TID) | ORAL | 1 refills | Status: DC

## 2022-10-31 NOTE — Progress Notes (Signed)
Patient ID: Kristin Olson, female   DOB: 02-10-1956, 67 y.o.   MRN: [id number]   Reason for Appointment: Endocrinology follow-up   History of Present Illness   Diagnosis: Type 2 DIABETES MELITUS, date of diagnosis: 1999      She has had mild diabetes for several years which has been usually well controlled with metformin alone  Current oral hypoglycemic drugs: Metformin ER 1000 mg daily  A1c is relatively low at 5.9  Current blood sugars, problems identified and self management: She was not able to use Rybelsus because of high out-of-pocket expense Prandin was previously stopped because of relatively high fasting readings She is generally tolerating metformin except in the last few days has had some gastrointestinal issues with diarrhea Again she has not been able to exercise Appears to have no significant weight change lately She did get prednisone recently and blood sugars were over 400 but are settling down However blood sugar monitoring has been intermittent and unclear what her current blood sugar patterns are Last fasting glucose was 94 and midday reading 192 Generally fasting blood sugars are not high She said that her meter is not accepting her test trips consistently  Side effects from medications: Diarrhea from metformin  Monitors blood glucose:  infrequently     Glucometer: One Touch.     Blood sugars at home by meter download   PRE-MEAL Fasting Lunch Dinner Bedtime Overall  Glucose range:     94-469  Mean/median: 133 212  323 187   POST-MEAL PC Breakfast PC Lunch PC Dinner  Glucose range:     Mean/median:      Previously:  PRE-MEAL Fasting Lunch Dinner Bedtime Overall  Glucose range: 104-239 66-280 64-104    Mean/median:     153   POST-MEAL PC Breakfast PC Lunch PC Dinner  Glucose range:   ?  Mean/median:       Side effects from medications:?  Nausea from Victoza            Wt Readings from Last 3 Encounters:  10/31/22 176 lb 3.2 oz (79.9  kg)  12/14/21 170 lb (77.1 kg)  07/17/21 178 lb (80.7 kg)     Lab Results  Component Value Date   HGBA1C 5.9 (H) 10/22/2022   HGBA1C 6.1 (H) 06/20/2022   HGBA1C 5.8 (H) 03/07/2022   Lab Results  Component Value Date   MICROALBUR 2.4 (H) 03/04/2018   LDLCALC 93 06/20/2022   CREATININE 1.12 (H) 10/22/2022   PRIOR history:  Because of somewhat higher blood sugars and weight gain she was tried on Byetta in 2006 and subsequently Victoza. Previously Victoza had helped her with portion control and weight loss.    However because she had difficulty with nausea when she was taking this it was stopped, also  could not afford this because of insurance noncoverage; she stopped taking this in 2014    OTHER active problems: See review of systems     Allergies as of 10/31/2022       Reactions   Levaquin [levofloxacin In D5w] Itching        Medication List        Accurate as of October 31, 2022 10:20 AM. If you have any questions, ask your nurse or doctor.          albuterol 108 (90 Base) MCG/ACT inhaler Commonly known as: VENTOLIN HFA Inhale into the lungs.   amoxicillin-clavulanate 875-125 MG tablet Commonly known as: AUGMENTIN Take 1 tablet by mouth  2 (two) times daily.   atorvastatin 10 MG tablet Commonly known as: LIPITOR Take 1 tablet (10 mg total) by mouth daily.   cefUROXime 500 MG tablet Commonly known as: CEFTIN cefuroxime axetil 500 mg tablet  TAKE 1 TABLET BY MOUTH TWICE DAILY   colestipol 1 g tablet Commonly known as: COLESTID Take 1 g by mouth 2 (two) times daily.   diazepam 5 MG tablet Commonly known as: VALIUM Take 5 mg by mouth 3 (three) times daily.   Dyanavel XR 10 MG Cher Generic drug: Amphetamine ER CHEW AND SWALLOW 1 TABLET BY MOUTH EVERY MORNING   gabapentin 400 MG capsule Commonly known as: NEURONTIN Take 400 mg by mouth 2 (two) times daily.   HYDROcodone-acetaminophen 5-325 MG tablet Commonly known as: NORCO/VICODIN Take 1  tablet by mouth daily.   metFORMIN 500 MG 24 hr tablet Commonly known as: GLUCOPHAGE-XR Take1 tablet with food for 7 days, increase to 2 tablets after 1 week   mirtazapine 7.5 MG tablet Commonly known as: REMERON Take 2 mg by mouth at bedtime.   omega-3 acid ethyl esters 1 g capsule Commonly known as: LOVAZA TAKE 2 CAPSULES BY MOUTH TWICE DAILY   omeprazole 20 MG capsule Commonly known as: PRILOSEC 40 mg.   OneTouch Delica Lancets 26O Misc 1 each by Other route 2 (two) times daily. Use as instructed to check blood sugar twice daily.DX:E11.9   OneTouch Verio Reflect w/Device Kit Use to check blood sugar daily   OneTouch Verio test strip Generic drug: glucose blood USE  STRIP TO CHECK GLUCOSE TWICE DAILY   oxybutynin 15 MG 24 hr tablet Commonly known as: DITROPAN XL Take 15 mg by mouth 2 (two) times daily.   repaglinide 0.5 MG tablet Commonly known as: PRANDIN Take 1 tablet (0.5 mg total) by mouth 3 (three) times daily before meals. Adjust dose based on meal size   risperiDONE 2 MG tablet Commonly known as: RISPERDAL Take 2 mg by mouth once.   Rybelsus 7 MG Tabs Generic drug: Semaglutide Take 1 tablet by mouth daily before breakfast. Take 30 minutes before breakfast with 4 oz. water What changed: Another medication with the same name was removed. Continue taking this medication, and follow the directions you see here. Changed by: Elayne Snare, MD   sertraline 100 MG tablet Commonly known as: ZOLOFT Take 100 mg by mouth daily. 2 tablets by mouth daily   sucralfate 1 g tablet Commonly known as: CARAFATE Take 1 g by mouth 4 (four) times daily -  with meals and at bedtime.   SUMAtriptan 100 MG tablet Commonly known as: IMITREX Take 100 mg by mouth every 4 (four) hours as needed for migraine.   trimethoprim 100 MG tablet Commonly known as: TRIMPEX Take 100 mg by mouth 2 (two) times daily.        No past medical history on file.  No past surgical history on  file.  Family History  Problem Relation Age of Onset   Diabetes Mother    Diabetes Maternal Grandfather     Social History:  reports that she quit smoking about 23 years ago. Her smoking use included cigarettes. She has never used smokeless tobacco. No history on file for alcohol use and drug use.  Allergies:  Allergies  Allergen Reactions   Levaquin [Levofloxacin In D5w] Itching     Review of systems:  RENAL dysfunction: Creatinine has been variable, looking improved now  She has history of recurrent UTI  Lab Results  Component  Value Date   CREATININE 1.12 (H) 10/22/2022   CREATININE 1.57 (H) 06/20/2022   CREATININE 1.45 (H) 03/07/2022    HYPERLIPIDEMIA: She has had high LDL, high triglycerides as also LDL particle number  and low HDL.   Previously her particle number was excellent at 555  She was told to discontinue her atorvastatin since LDL was only 48 previously  She is on Lovaza She is also on Colestid for diarrhea  Previously on fenofibrate  Also has persistently low HDL, last lipid panel as below  Lab Results  Component Value Date   CHOL 145 06/20/2022   CHOL 129 11/13/2021   CHOL 99 (L) 07/06/2021   Lab Results  Component Value Date   HDL 33 (L) 06/20/2022   HDL 33 (L) 11/13/2021   HDL 33 (L) 07/06/2021   Lab Results  Component Value Date   LDLCALC 93 06/20/2022   LDLCALC 74 11/13/2021   LDLCALC 48 07/06/2021   Lab Results  Component Value Date   TRIG 100 06/20/2022   TRIG 124 11/13/2021   TRIG 94 07/06/2021   Lab Results  Component Value Date   CHOLHDL 4.4 06/20/2022   CHOLHDL 3.9 11/13/2021   CHOLHDL 3.0 07/06/2021   No results found for: "LDLDIRECT"   Lab Results  Component Value Date   ALT 18 06/20/2022     GI: She  had several surgeries because of biliary duct obstruction She has been [facility] at [facility]   Lab Results  Component Value Date   ALKPHOS 166 (H) 06/20/2022       Examination:   BP 132/70   Pulse 94   Ht  5\' 4"  (1.626 m)   Wt 176 lb 3.2 oz (79.9 kg)   SpO2 95%   BMI 30.24 kg/m   Body mass index is 30.24 kg/m.    Assesment/PLAN: 1/24  Diabetes type 2, mild and currently only on metformin  Her A1c is 5.9  Blood sugars are higher recently and partly related to prednisone and intercurrent illness  However monitoring has been intermittent Appears to be mostly having some postprandial hyperglycemia with relatively better fasting readings compared to when she was prescribed Rybelsus in October  She cannot afford GLP-1 drugs like Rybelsus apparently  SGLT2 drugs relatively contraindicated because of frequent UTIs For now we will give her a trial of low-dose PRANDIN 0.5 mg before each meal depending on the meal size and skip the dose if not eating any carbohydrate Continue metformin but may skip or reduce the dose if having diarrhea  Renal function: GFR relatively better at 54  LIPIDS: To recheck on the next visit   3 months follow-up with fasting labs and A1c  Elayne Snare 10/31/2022, 10:20 AM

## 2022-11-01 ENCOUNTER — Ambulatory Visit: Payer: Medicare Other | Admitting: Endocrinology

## 2022-11-01 DIAGNOSIS — M1711 Unilateral primary osteoarthritis, right knee: Secondary | ICD-10-CM | POA: Diagnosis not present

## 2022-11-01 DIAGNOSIS — S8261XA Displaced fracture of lateral malleolus of right fibula, initial encounter for closed fracture: Secondary | ICD-10-CM | POA: Diagnosis not present

## 2022-11-05 DIAGNOSIS — R053 Chronic cough: Secondary | ICD-10-CM | POA: Diagnosis not present

## 2022-11-05 DIAGNOSIS — U099 Post covid-19 condition, unspecified: Secondary | ICD-10-CM | POA: Diagnosis not present

## 2022-11-05 DIAGNOSIS — R059 Cough, unspecified: Secondary | ICD-10-CM | POA: Diagnosis not present

## 2022-11-05 DIAGNOSIS — R058 Other specified cough: Secondary | ICD-10-CM | POA: Diagnosis not present

## 2022-11-05 DIAGNOSIS — K591 Functional diarrhea: Secondary | ICD-10-CM | POA: Diagnosis not present

## 2022-11-06 DIAGNOSIS — F3341 Major depressive disorder, recurrent, in partial remission: Secondary | ICD-10-CM | POA: Diagnosis not present

## 2022-11-06 DIAGNOSIS — F909 Attention-deficit hyperactivity disorder, unspecified type: Secondary | ICD-10-CM | POA: Diagnosis not present

## 2022-11-06 DIAGNOSIS — G4733 Obstructive sleep apnea (adult) (pediatric): Secondary | ICD-10-CM | POA: Diagnosis not present

## 2022-11-07 DIAGNOSIS — R053 Chronic cough: Secondary | ICD-10-CM | POA: Diagnosis not present

## 2022-11-07 DIAGNOSIS — R059 Cough, unspecified: Secondary | ICD-10-CM | POA: Diagnosis not present

## 2022-11-09 DIAGNOSIS — R197 Diarrhea, unspecified: Secondary | ICD-10-CM | POA: Diagnosis not present

## 2022-11-13 ENCOUNTER — Telehealth: Payer: Self-pay

## 2022-11-13 NOTE — Telephone Encounter (Signed)
Patient received ozempic from patient assistance (2 boxes) states that medication made her sick or maybe she was catching the flu. She is willing to try the medication again.

## 2022-11-14 NOTE — Telephone Encounter (Signed)
Patient is aware when I spoke with her yesterday.

## 2022-11-15 ENCOUNTER — Encounter: Payer: Self-pay | Admitting: Endocrinology

## 2022-11-15 DIAGNOSIS — E1165 Type 2 diabetes mellitus with hyperglycemia: Secondary | ICD-10-CM

## 2022-11-15 DIAGNOSIS — F331 Major depressive disorder, recurrent, moderate: Secondary | ICD-10-CM | POA: Diagnosis not present

## 2022-11-15 NOTE — Telephone Encounter (Signed)
Patient's spouse came in to office today and picked up 2 boxes of patient assistance Ozempic.

## 2022-11-18 MED ORDER — PEN NEEDLES 32G X 4 MM MISC: 1 refills | Status: AC

## 2022-11-29 ENCOUNTER — Other Ambulatory Visit: Payer: Self-pay | Admitting: Endocrinology

## 2022-11-29 DIAGNOSIS — S8261XA Displaced fracture of lateral malleolus of right fibula, initial encounter for closed fracture: Secondary | ICD-10-CM | POA: Diagnosis not present

## 2022-11-29 DIAGNOSIS — M1711 Unilateral primary osteoarthritis, right knee: Secondary | ICD-10-CM | POA: Diagnosis not present

## 2022-11-29 DIAGNOSIS — E119 Type 2 diabetes mellitus without complications: Secondary | ICD-10-CM

## 2022-12-09 DIAGNOSIS — K080 Exfoliation of teeth due to systemic causes: Secondary | ICD-10-CM | POA: Diagnosis not present

## 2022-12-11 ENCOUNTER — Encounter: Payer: Self-pay | Admitting: Endocrinology

## 2022-12-11 DIAGNOSIS — E1165 Type 2 diabetes mellitus with hyperglycemia: Secondary | ICD-10-CM

## 2022-12-11 DIAGNOSIS — F331 Major depressive disorder, recurrent, moderate: Secondary | ICD-10-CM | POA: Diagnosis not present

## 2022-12-13 MED ORDER — GLIPIZIDE ER 2.5 MG PO TB24: 2.5000 mg | ORAL_TABLET | Freq: Every day | ORAL | 1 refills | Status: DC

## 2022-12-13 NOTE — Addendum Note (Signed)
Addended by: Lauralyn Primes on: 12/13/2022 11:35 AM   Modules accepted: Orders

## 2022-12-17 DIAGNOSIS — M5416 Radiculopathy, lumbar region: Secondary | ICD-10-CM | POA: Diagnosis not present

## 2022-12-17 DIAGNOSIS — M47896 Other spondylosis, lumbar region: Secondary | ICD-10-CM | POA: Diagnosis not present

## 2022-12-27 NOTE — Telephone Encounter (Signed)
Patient picked up Ozempic patient assistance  ?

## 2022-12-30 DIAGNOSIS — Z01818 Encounter for other preprocedural examination: Secondary | ICD-10-CM | POA: Diagnosis not present

## 2022-12-30 DIAGNOSIS — M79609 Pain in unspecified limb: Secondary | ICD-10-CM | POA: Diagnosis not present

## 2022-12-30 DIAGNOSIS — E559 Vitamin D deficiency, unspecified: Secondary | ICD-10-CM | POA: Diagnosis not present

## 2022-12-30 DIAGNOSIS — Z79899 Other long term (current) drug therapy: Secondary | ICD-10-CM | POA: Diagnosis not present

## 2023-01-01 DIAGNOSIS — F331 Major depressive disorder, recurrent, moderate: Secondary | ICD-10-CM | POA: Diagnosis not present

## 2023-01-03 LAB — HM DIABETES EYE EXAM

## 2023-01-13 DIAGNOSIS — M5416 Radiculopathy, lumbar region: Secondary | ICD-10-CM | POA: Diagnosis not present

## 2023-01-15 ENCOUNTER — Encounter: Payer: Self-pay | Admitting: Endocrinology

## 2023-01-15 ENCOUNTER — Other Ambulatory Visit: Payer: Self-pay | Admitting: Endocrinology

## 2023-01-15 ENCOUNTER — Telehealth: Payer: Medicare Other | Admitting: Endocrinology

## 2023-01-15 VITALS — BP 127/71 | Ht 64.0 in | Wt 175.0 lb

## 2023-01-15 DIAGNOSIS — E782 Mixed hyperlipidemia: Secondary | ICD-10-CM | POA: Diagnosis not present

## 2023-01-15 DIAGNOSIS — E1165 Type 2 diabetes mellitus with hyperglycemia: Secondary | ICD-10-CM

## 2023-01-15 MED ORDER — OZEMPIC (0.25 OR 0.5 MG/DOSE) 2 MG/3ML ~~LOC~~ SOPN: 0.5000 mg | PEN_INJECTOR | SUBCUTANEOUS | 1 refills | Status: DC

## 2023-01-15 NOTE — Progress Notes (Signed)
Patient ID: Kristin Olson, female   DOB: 03-28-1956, 67 y.o.   MRN: [id number]  I connected with the above-named patient by video enabled telemedicine application and verified that I am speaking with the correct person. The patient was explained the limitations of evaluation and management by telemedicine and the availability of in person appointments.  Patient also understood that there may be a patient responsible charge related to this service  Location of the patient: Patient's home  Location of the provider: Physician office Only the patient and myself were participating in the encounter The patient understood the above statements and agreed to proceed.   Reason for Appointment: Endocrinology follow-up   History of Present Illness   Diagnosis: Type 2 DIABETES MELITUS, date of diagnosis: 1999      She has had mild diabetes for several years which has been usually well controlled with metformin alone  Current oral hypoglycemic drugs: Metformin ER 1000 mg daily, Ozempic 0.25 mg weekly  A1c is pending  Current blood sugars, problems identified and self management:  She is tolerating Ozempic but did not increase her dose to 5 mg This was obtained through patient assistance program about 2 months ago  However she was also concerned about her blood sugars going up with Ozempic alone and glipizide ER was added on 12/13/2022 Blood sugars were obtained by patient review of her monitor and not clear what times of the day she is testing She does have sporadic high readings over 200 still but not clear if these are related to excess carbohydrate intake or regular soft drinks Her weight is about the same at 175 She has not continued metformin because of diarrhea She again says that her meter is not accepting her test trips consistently and is wasting some strips  Side effects from medications: Diarrhea from metformin  Monitors blood glucose:  infrequently     Glucometer: One  Touch.     Blood sugars at home by meter review  FASTING range 106-151 Nonfasting 71-218, average not available  Previously:   PRE-MEAL Fasting Lunch Dinner Bedtime Overall  Glucose range:     94-469  Mean/median: 133 212  323 187   POST-MEAL PC Breakfast PC Lunch PC Dinner  Glucose range:     Mean/median:      Previously:  PRE-MEAL Fasting Lunch Dinner Bedtime Overall  Glucose range: 104-239 66-280 64-104    Mean/median:     153   POST-MEAL PC Breakfast PC Lunch PC Dinner  Glucose range:   ?  Mean/median:       Side effects from medications:?  Nausea from Victoza            Wt Readings from Last 3 Encounters:  10/31/22 176 lb 3.2 oz (79.9 kg)  12/14/21 170 lb (77.1 kg)  07/17/21 178 lb (80.7 kg)     Lab Results  Component Value Date   HGBA1C 5.9 (H) 10/22/2022   HGBA1C 6.1 (H) 06/20/2022   HGBA1C 5.8 (H) 03/07/2022   Lab Results  Component Value Date   MICROALBUR 2.4 (H) 03/04/2018   LDLCALC 93 06/20/2022   CREATININE 1.12 (H) 10/22/2022   PRIOR history:  Because of somewhat higher blood sugars and weight gain she was tried on Byetta in 2006 and subsequently Victoza. Previously Victoza had helped her with portion control and weight loss.    However because she had difficulty with nausea when she was taking this it was stopped, also  could not afford this because  of insurance noncoverage; she stopped taking this in 2014    OTHER active problems: See review of systems     Allergies as of 10/31/2022       Reactions   Levaquin [levofloxacin In D5w] Itching        Medication List        Accurate as of October 31, 2022 10:20 AM. If you have any questions, ask your nurse or doctor.          albuterol 108 (90 Base) MCG/ACT inhaler Commonly known as: VENTOLIN HFA Inhale into the lungs.   amoxicillin-clavulanate 875-125 MG tablet Commonly known as: AUGMENTIN Take 1 tablet by mouth 2 (two) times daily.   atorvastatin 10 MG tablet Commonly  known as: LIPITOR Take 1 tablet (10 mg total) by mouth daily.   cefUROXime 500 MG tablet Commonly known as: CEFTIN cefuroxime axetil 500 mg tablet  TAKE 1 TABLET BY MOUTH TWICE DAILY   colestipol 1 g tablet Commonly known as: COLESTID Take 1 g by mouth 2 (two) times daily.   diazepam 5 MG tablet Commonly known as: VALIUM Take 5 mg by mouth 3 (three) times daily.   Dyanavel XR 10 MG Cher Generic drug: Amphetamine ER CHEW AND SWALLOW 1 TABLET BY MOUTH EVERY MORNING   gabapentin 400 MG capsule Commonly known as: NEURONTIN Take 400 mg by mouth 2 (two) times daily.   HYDROcodone-acetaminophen 5-325 MG tablet Commonly known as: NORCO/VICODIN Take 1 tablet by mouth daily.   metFORMIN 500 MG 24 hr tablet Commonly known as: GLUCOPHAGE-XR Take1 tablet with food for 7 days, increase to 2 tablets after 1 week   mirtazapine 7.5 MG tablet Commonly known as: REMERON Take 2 mg by mouth at bedtime.   omega-3 acid ethyl esters 1 g capsule Commonly known as: LOVAZA TAKE 2 CAPSULES BY MOUTH TWICE DAILY   omeprazole 20 MG capsule Commonly known as: PRILOSEC 40 mg.   OneTouch Delica Lancets 99991111 Misc 1 each by Other route 2 (two) times daily. Use as instructed to check blood sugar twice daily.DX:E11.9   OneTouch Verio Reflect w/Device Kit Use to check blood sugar daily   OneTouch Verio test strip Generic drug: glucose blood USE  STRIP TO CHECK GLUCOSE TWICE DAILY   oxybutynin 15 MG 24 hr tablet Commonly known as: DITROPAN XL Take 15 mg by mouth 2 (two) times daily.   repaglinide 0.5 MG tablet Commonly known as: PRANDIN Take 1 tablet (0.5 mg total) by mouth 3 (three) times daily before meals. Adjust dose based on meal size   risperiDONE 2 MG tablet Commonly known as: RISPERDAL Take 2 mg by mouth once.   Rybelsus 7 MG Tabs Generic drug: Semaglutide Take 1 tablet by mouth daily before breakfast. Take 30 minutes before breakfast with 4 oz. water What changed: Another  medication with the same name was removed. Continue taking this medication, and follow the directions you see here. Changed by: Elayne Snare, MD   sertraline 100 MG tablet Commonly known as: ZOLOFT Take 100 mg by mouth daily. 2 tablets by mouth daily   sucralfate 1 g tablet Commonly known as: CARAFATE Take 1 g by mouth 4 (four) times daily -  with meals and at bedtime.   SUMAtriptan 100 MG tablet Commonly known as: IMITREX Take 100 mg by mouth every 4 (four) hours as needed for migraine.   trimethoprim 100 MG tablet Commonly known as: TRIMPEX Take 100 mg by mouth 2 (two) times daily.  No past medical history on file.  No past surgical history on file.  Family History  Problem Relation Age of Onset   Diabetes Mother    Diabetes Maternal Grandfather     Social History:  reports that she quit smoking about 23 years ago. Her smoking use included cigarettes. She has never used smokeless tobacco. No history on file for alcohol use and drug use.  Allergies:  Allergies  Allergen Reactions   Levaquin [Levofloxacin In D5w] Itching     Review of systems:  RENAL dysfunction: Creatinine has been variable, looking improved now  She has history of recurrent UTI  Lab Results  Component Value Date   CREATININE 1.12 (H) 10/22/2022   CREATININE 1.57 (H) 06/20/2022   CREATININE 1.45 (H) 03/07/2022    HYPERLIPIDEMIA: She has had high LDL, high triglycerides as also LDL particle number  and low HDL.   Previously her particle number was excellent at 555  She was told to discontinue her atorvastatin since LDL was only 48 previously, subsequently it was still below 100  She is on Lovaza She is also on Colestid for diarrhea  Previously on fenofibrate  Also has persistently low HDL, results of lipid panel not available as yet   Lab Results  Component Value Date   CHOL 145 06/20/2022   CHOL 129 11/13/2021   CHOL 99 (L) 07/06/2021   Lab Results  Component Value Date    HDL 33 (L) 06/20/2022   HDL 33 (L) 11/13/2021   HDL 33 (L) 07/06/2021   Lab Results  Component Value Date   LDLCALC 93 06/20/2022   LDLCALC 74 11/13/2021   LDLCALC 48 07/06/2021   Lab Results  Component Value Date   TRIG 100 06/20/2022   TRIG 124 11/13/2021   TRIG 94 07/06/2021   Lab Results  Component Value Date   CHOLHDL 4.4 06/20/2022   CHOLHDL 3.9 11/13/2021   CHOLHDL 3.0 07/06/2021   No results found for: "LDLDIRECT"   Lab Results  Component Value Date   ALT 18 06/20/2022     GI: She  had several surgeries because of biliary duct obstruction She has been followed at [facility]   Lab Results  Component Value Date   ALKPHOS 166 (H) 06/20/2022   She is due to have knee replacement on 01/30/2023    Examination:   BP 132/70   Pulse 94   Ht 5\' 4"  (1.626 m)   Wt 176 lb 3.2 oz (79.9 kg)   SpO2 95%   BMI 30.24 kg/m   Body mass index is 30.24 kg/m.    Assesment/PLAN: 1/24  Diabetes type 2, mild and currently on glipizide and Ozempic 0.25 mg weekly  Her A1c is previously 5.9  Blood sugars are generally better but difficult to assess as meter was not downloaded Also she has not increased her Ozempic beyond the 0.25 mg dose  Plan: Will await results of her A1c test If she is going to be stopping Ozempic for her knee surgery for at least 2 weeks she can continue 0.25 mg for now but 4 weeks after restarting this after surgery can go up to 0.5 mg If her blood sugars are below 90 she can stop glipizide More consistent monitoring 2 hours after meals Avoid excessive carbohydrate regular soft drinks She will call [employer] to troubleshoot her meter problems with current test trips  Labs for renal functions are pending also  LIPIDS: Will review when labs available and discuss further with patient,  if LDL is trending higher may restart Lipitor  3 months follow-up with fasting labs and A1c  Elayne Snare 10/31/2022, 10:20 AM

## 2023-01-21 ENCOUNTER — Encounter: Payer: Self-pay | Admitting: Endocrinology

## 2023-01-23 DIAGNOSIS — W19XXXA Unspecified fall, initial encounter: Secondary | ICD-10-CM | POA: Diagnosis not present

## 2023-01-23 DIAGNOSIS — S42302A Unspecified fracture of shaft of humerus, left arm, initial encounter for closed fracture: Secondary | ICD-10-CM | POA: Diagnosis not present

## 2023-01-23 DIAGNOSIS — F331 Major depressive disorder, recurrent, moderate: Secondary | ICD-10-CM | POA: Diagnosis not present

## 2023-01-23 DIAGNOSIS — S42213A Unspecified displaced fracture of surgical neck of unspecified humerus, initial encounter for closed fracture: Secondary | ICD-10-CM | POA: Diagnosis not present

## 2023-01-23 DIAGNOSIS — S42215A Unspecified nondisplaced fracture of surgical neck of left humerus, initial encounter for closed fracture: Secondary | ICD-10-CM | POA: Diagnosis not present

## 2023-01-23 DIAGNOSIS — M25512 Pain in left shoulder: Secondary | ICD-10-CM | POA: Diagnosis not present

## 2023-01-23 DIAGNOSIS — S42202A Unspecified fracture of upper end of left humerus, initial encounter for closed fracture: Secondary | ICD-10-CM | POA: Diagnosis not present

## 2023-01-28 DIAGNOSIS — S42202A Unspecified fracture of upper end of left humerus, initial encounter for closed fracture: Secondary | ICD-10-CM | POA: Diagnosis not present

## 2023-01-31 ENCOUNTER — Telehealth: Payer: Medicare Other | Admitting: Endocrinology

## 2023-02-04 DIAGNOSIS — F331 Major depressive disorder, recurrent, moderate: Secondary | ICD-10-CM | POA: Diagnosis not present

## 2023-02-05 DIAGNOSIS — S42202A Unspecified fracture of upper end of left humerus, initial encounter for closed fracture: Secondary | ICD-10-CM | POA: Diagnosis not present

## 2023-02-06 DIAGNOSIS — M5416 Radiculopathy, lumbar region: Secondary | ICD-10-CM | POA: Diagnosis not present

## 2023-02-06 DIAGNOSIS — M47896 Other spondylosis, lumbar region: Secondary | ICD-10-CM | POA: Diagnosis not present

## 2023-02-11 DIAGNOSIS — H353131 Nonexudative age-related macular degeneration, bilateral, early dry stage: Secondary | ICD-10-CM | POA: Diagnosis not present

## 2023-02-13 DIAGNOSIS — G4733 Obstructive sleep apnea (adult) (pediatric): Secondary | ICD-10-CM | POA: Diagnosis not present

## 2023-02-20 DIAGNOSIS — F331 Major depressive disorder, recurrent, moderate: Secondary | ICD-10-CM | POA: Diagnosis not present

## 2023-02-21 NOTE — Telephone Encounter (Signed)
Meds picked up by pts husband and he verified DOB.

## 2023-02-24 DIAGNOSIS — S42202A Unspecified fracture of upper end of left humerus, initial encounter for closed fracture: Secondary | ICD-10-CM | POA: Diagnosis not present

## 2023-03-06 DIAGNOSIS — K080 Exfoliation of teeth due to systemic causes: Secondary | ICD-10-CM | POA: Diagnosis not present

## 2023-03-07 DIAGNOSIS — M79602 Pain in left arm: Secondary | ICD-10-CM | POA: Diagnosis not present

## 2023-03-07 DIAGNOSIS — M62512 Muscle wasting and atrophy, not elsewhere classified, left shoulder: Secondary | ICD-10-CM | POA: Diagnosis not present

## 2023-03-07 DIAGNOSIS — S42202D Unspecified fracture of upper end of left humerus, subsequent encounter for fracture with routine healing: Secondary | ICD-10-CM | POA: Diagnosis not present

## 2023-03-07 DIAGNOSIS — M62522 Muscle wasting and atrophy, not elsewhere classified, left upper arm: Secondary | ICD-10-CM | POA: Diagnosis not present

## 2023-03-18 DIAGNOSIS — Z79899 Other long term (current) drug therapy: Secondary | ICD-10-CM | POA: Diagnosis not present

## 2023-03-18 DIAGNOSIS — M5416 Radiculopathy, lumbar region: Secondary | ICD-10-CM | POA: Diagnosis not present

## 2023-03-18 DIAGNOSIS — M47896 Other spondylosis, lumbar region: Secondary | ICD-10-CM | POA: Diagnosis not present

## 2023-03-20 DIAGNOSIS — F331 Major depressive disorder, recurrent, moderate: Secondary | ICD-10-CM | POA: Diagnosis not present

## 2023-03-21 DIAGNOSIS — S42202D Unspecified fracture of upper end of left humerus, subsequent encounter for fracture with routine healing: Secondary | ICD-10-CM | POA: Diagnosis not present

## 2023-03-21 DIAGNOSIS — M62522 Muscle wasting and atrophy, not elsewhere classified, left upper arm: Secondary | ICD-10-CM | POA: Diagnosis not present

## 2023-03-21 DIAGNOSIS — M62512 Muscle wasting and atrophy, not elsewhere classified, left shoulder: Secondary | ICD-10-CM | POA: Diagnosis not present

## 2023-03-21 DIAGNOSIS — M79602 Pain in left arm: Secondary | ICD-10-CM | POA: Diagnosis not present

## 2023-03-24 DIAGNOSIS — N3091 Cystitis, unspecified with hematuria: Secondary | ICD-10-CM | POA: Diagnosis not present

## 2023-03-27 DIAGNOSIS — D179 Benign lipomatous neoplasm, unspecified: Secondary | ICD-10-CM | POA: Diagnosis not present

## 2023-04-01 DIAGNOSIS — G4733 Obstructive sleep apnea (adult) (pediatric): Secondary | ICD-10-CM | POA: Diagnosis not present

## 2023-04-01 DIAGNOSIS — E669 Obesity, unspecified: Secondary | ICD-10-CM | POA: Diagnosis not present

## 2023-04-01 DIAGNOSIS — Z01810 Encounter for preprocedural cardiovascular examination: Secondary | ICD-10-CM | POA: Diagnosis not present

## 2023-04-01 DIAGNOSIS — I10 Essential (primary) hypertension: Secondary | ICD-10-CM | POA: Diagnosis not present

## 2023-04-01 DIAGNOSIS — Z01818 Encounter for other preprocedural examination: Secondary | ICD-10-CM | POA: Diagnosis not present

## 2023-04-02 DIAGNOSIS — E669 Obesity, unspecified: Secondary | ICD-10-CM | POA: Diagnosis not present

## 2023-04-02 DIAGNOSIS — K080 Exfoliation of teeth due to systemic causes: Secondary | ICD-10-CM | POA: Diagnosis not present

## 2023-04-02 DIAGNOSIS — E1169 Type 2 diabetes mellitus with other specified complication: Secondary | ICD-10-CM | POA: Diagnosis not present

## 2023-04-03 LAB — BASIC METABOLIC PANEL
BUN/Creatinine Ratio: 12 (ref 12–28)
BUN: 15 mg/dL (ref 8–27)
CO2: 22 mmol/L (ref 20–29)
Calcium: 9.1 mg/dL (ref 8.7–10.3)
Chloride: 104 mmol/L (ref 96–106)
Creatinine, Ser: 1.27 mg/dL — ABNORMAL HIGH (ref 0.57–1.00)
Glucose: 121 mg/dL — ABNORMAL HIGH (ref 70–99)
Potassium: 4.3 mmol/L (ref 3.5–5.2)
Sodium: 141 mmol/L (ref 134–144)
eGFR: 46 mL/min/{1.73_m2} — ABNORMAL LOW (ref >59)

## 2023-04-03 LAB — HEMOGLOBIN A1C
Est. average glucose Bld gHb Est-mCnc: 117 mg/dL
Hgb A1c MFr Bld: 5.7 % — ABNORMAL HIGH (ref 4.8–5.6)

## 2023-04-15 DIAGNOSIS — M5416 Radiculopathy, lumbar region: Secondary | ICD-10-CM | POA: Diagnosis not present

## 2023-04-18 DIAGNOSIS — E785 Hyperlipidemia, unspecified: Secondary | ICD-10-CM | POA: Diagnosis not present

## 2023-04-18 DIAGNOSIS — Z87891 Personal history of nicotine dependence: Secondary | ICD-10-CM | POA: Diagnosis not present

## 2023-04-18 DIAGNOSIS — E119 Type 2 diabetes mellitus without complications: Secondary | ICD-10-CM | POA: Diagnosis not present

## 2023-04-18 DIAGNOSIS — D1721 Benign lipomatous neoplasm of skin and subcutaneous tissue of right arm: Secondary | ICD-10-CM | POA: Diagnosis not present

## 2023-04-18 DIAGNOSIS — Z7985 Long-term (current) use of injectable non-insulin antidiabetic drugs: Secondary | ICD-10-CM | POA: Diagnosis not present

## 2023-04-18 DIAGNOSIS — D1722 Benign lipomatous neoplasm of skin and subcutaneous tissue of left arm: Secondary | ICD-10-CM | POA: Diagnosis not present

## 2023-04-18 DIAGNOSIS — K219 Gastro-esophageal reflux disease without esophagitis: Secondary | ICD-10-CM | POA: Diagnosis not present

## 2023-04-18 DIAGNOSIS — Z981 Arthrodesis status: Secondary | ICD-10-CM | POA: Diagnosis not present

## 2023-04-18 DIAGNOSIS — Z7984 Long term (current) use of oral hypoglycemic drugs: Secondary | ICD-10-CM | POA: Diagnosis not present

## 2023-04-18 DIAGNOSIS — D1723 Benign lipomatous neoplasm of skin and subcutaneous tissue of right leg: Secondary | ICD-10-CM | POA: Diagnosis not present

## 2023-04-18 DIAGNOSIS — D1724 Benign lipomatous neoplasm of skin and subcutaneous tissue of left leg: Secondary | ICD-10-CM | POA: Diagnosis not present

## 2023-04-18 DIAGNOSIS — G4733 Obstructive sleep apnea (adult) (pediatric): Secondary | ICD-10-CM | POA: Diagnosis not present

## 2023-04-18 DIAGNOSIS — Z6831 Body mass index (BMI) 31.0-31.9, adult: Secondary | ICD-10-CM | POA: Diagnosis not present

## 2023-04-21 DIAGNOSIS — L03113 Cellulitis of right upper limb: Secondary | ICD-10-CM | POA: Diagnosis not present

## 2023-04-21 DIAGNOSIS — D179 Benign lipomatous neoplasm, unspecified: Secondary | ICD-10-CM | POA: Diagnosis not present

## 2023-04-21 DIAGNOSIS — Z4801 Encounter for change or removal of surgical wound dressing: Secondary | ICD-10-CM | POA: Diagnosis not present

## 2023-04-21 DIAGNOSIS — Y838 Other surgical procedures as the cause of abnormal reaction of the patient, or of later complication, without mention of misadventure at the time of the procedure: Secondary | ICD-10-CM | POA: Diagnosis not present

## 2023-04-21 DIAGNOSIS — L03116 Cellulitis of left lower limb: Secondary | ICD-10-CM | POA: Diagnosis not present

## 2023-04-21 DIAGNOSIS — Z09 Encounter for follow-up examination after completed treatment for conditions other than malignant neoplasm: Secondary | ICD-10-CM | POA: Diagnosis not present

## 2023-04-21 DIAGNOSIS — W19XXXA Unspecified fall, initial encounter: Secondary | ICD-10-CM | POA: Diagnosis not present

## 2023-04-21 DIAGNOSIS — L03114 Cellulitis of left upper limb: Secondary | ICD-10-CM | POA: Diagnosis not present

## 2023-04-21 DIAGNOSIS — L7682 Other postprocedural complications of skin and subcutaneous tissue: Secondary | ICD-10-CM | POA: Diagnosis not present

## 2023-04-21 DIAGNOSIS — L03115 Cellulitis of right lower limb: Secondary | ICD-10-CM | POA: Diagnosis not present

## 2023-04-21 DIAGNOSIS — S80211A Abrasion, right knee, initial encounter: Secondary | ICD-10-CM | POA: Diagnosis not present

## 2023-04-21 DIAGNOSIS — T8149XA Infection following a procedure, other surgical site, initial encounter: Secondary | ICD-10-CM | POA: Diagnosis not present

## 2023-04-22 ENCOUNTER — Telehealth (INDEPENDENT_AMBULATORY_CARE_PROVIDER_SITE_OTHER): Payer: Medicare Other | Admitting: Endocrinology

## 2023-04-22 DIAGNOSIS — E1165 Type 2 diabetes mellitus with hyperglycemia: Secondary | ICD-10-CM | POA: Diagnosis not present

## 2023-04-22 DIAGNOSIS — Z7984 Long term (current) use of oral hypoglycemic drugs: Secondary | ICD-10-CM | POA: Diagnosis not present

## 2023-04-22 DIAGNOSIS — E782 Mixed hyperlipidemia: Secondary | ICD-10-CM | POA: Diagnosis not present

## 2023-04-22 LAB — GLUCOSE, POCT (MANUAL RESULT ENTRY)

## 2023-04-22 NOTE — Progress Notes (Signed)
Patient ID: Kristin Olson, female   DOB: 11-24-1955, 67 y.o.   MRN: [id number]  I connected with the above-named patient by video enabled telemedicine application and verified that I am speaking with the correct person. The patient was explained the limitations of evaluation and management by telemedicine and the availability of in person appointments.  Patient also understood that there may be a patient responsible charge related to this service  Location of the patient: Patient's home  Location of the provider: Physician office Only the patient and myself were participating in the encounter The patient understood the above statements and agreed to proceed.   Reason for Appointment: Endocrinology follow-up   History of Present Illness   Diagnosis: Type 2 DIABETES MELITUS, date of diagnosis: 1999      She has had mild diabetes for several years which has been usually well controlled with metformin alone  Current oral hypoglycemic drugs: Glipizide ER 2.5 mg, Ozempic 0.25 mg weekly  A1c is about the same at 5.7  Current blood sugars, problems identified and self management:  She is still taking Ozempic but despite reminders she did not again increase her dose to 5 mg She does have sporadic readings over 200 as before but unable to get accurate information from her on her meter and not clear what her average reading is She says that occasionally blood sugars may be low normal also but no hypoglycemia Previously her metformin was stopped because of diarrhea Glipizide was added when she thought her blood sugars were higher with Ozempic alone Currently getting Ozempic through the patient assistance program Lab glucose was 121 Fructosamine has not been checked previously With her weight recently before she started having episodes of vomiting was 180 pounds  Side effects from medications: Diarrhea from metformin  Monitors blood glucose:  infrequently     Glucometer: One Touch.      Blood sugars at home by meter review as above  Previously: FASTING range 106-151 Nonfasting 71-218, average not available   Side effects from medications:?  Nausea from Victoza, diarrhea from metformin            Wt Readings from Last 3 Encounters:  01/15/23 175 lb (79.4 kg)  10/31/22 176 lb 3.2 oz (79.9 kg)  12/14/21 170 lb (77.1 kg)     Lab Results  Component Value Date   HGBA1C 5.7 (H) 04/02/2023   HGBA1C 5.9 (H) 10/22/2022   HGBA1C 6.1 (H) 06/20/2022   Lab Results  Component Value Date   MICROALBUR 2.4 (H) 03/04/2018   LDLCALC 93 06/20/2022   CREATININE 1.27 (H) 04/02/2023   PRIOR history:  Because of somewhat higher blood sugars and weight gain she was tried on Byetta in 2006 and subsequently Victoza. Previously Victoza had helped her with portion control and weight loss.    However because she had difficulty with nausea when she was taking this it was stopped, also  could not afford this because of insurance noncoverage; she stopped taking this in 2014    OTHER active problems: See review of systems   Allergies as of 04/22/2023       Reactions   Levaquin [levofloxacin In D5w] Itching        Medication List        Accurate as of April 22, 2023 10:51 AM. If you have any questions, ask your nurse or doctor.          atorvastatin 10 MG tablet Commonly known as: LIPITOR Take 1 tablet (  10 mg total) by mouth daily.   cephALEXin 500 MG capsule Commonly known as: KEFLEX Take 500 mg by mouth 4 (four) times daily.   colestipol 1 g tablet Commonly known as: COLESTID Take 1 g by mouth 2 (two) times daily.   diazepam 5 MG tablet Commonly known as: VALIUM Take 5 mg by mouth 3 (three) times daily.   diltiazem 120 MG 24 hr capsule Commonly known as: CARDIZEM CD Take 120 mg by mouth daily.   estradiol 0.1 MG/GM vaginal cream Commonly known as: ESTRACE SMARTSIG:Sparingly Vaginal 3 Times a Week   furosemide 20 MG tablet Commonly known as:  LASIX TAKE 1 TABLET(20 MG) BY MOUTH DAILY AS NEEDED   gabapentin 400 MG capsule Commonly known as: NEURONTIN Take 400 mg by mouth 2 (two) times daily.   glipiZIDE 2.5 MG 24 hr tablet Commonly known as: GLUCOTROL XL Take 1 tablet (2.5 mg total) by mouth daily with breakfast.   HYDROcodone-acetaminophen 5-325 MG tablet Commonly known as: NORCO/VICODIN Take 1 tablet by mouth daily.   mirtazapine 7.5 MG tablet Commonly known as: REMERON Take 2 mg by mouth at bedtime.   omega-3 acid ethyl esters 1 g capsule Commonly known as: LOVAZA TAKE 2 CAPSULES BY MOUTH TWICE DAILY   omeprazole 20 MG capsule Commonly known as: PRILOSEC 40 mg.   ondansetron 4 MG disintegrating tablet Commonly known as: ZOFRAN-ODT DISSOLVE 1 TABLET(4 MG) ON THE TONGUE EVERY 8 HOURS AS NEEDED FOR NAUSEA   ondansetron 8 MG disintegrating tablet Commonly known as: ZOFRAN-ODT DISSOLVE 1 TABLET(8 MG) ON THE TONGUE EVERY 8 HOURS AS NEEDED FOR NAUSEA   OneTouch Delica Lancets 30G Misc 1 each by Other route 2 (two) times daily. Use as instructed to check blood sugar twice daily.DX:E11.9   OneTouch Verio Reflect w/Device Kit Use to check blood sugar daily   OneTouch Verio test strip Generic drug: glucose blood USE STRIP TO CHECK GLUCOSE TWICE DAILY   oxybutynin 15 MG 24 hr tablet Commonly known as: DITROPAN XL Take 15 mg by mouth 2 (two) times daily.   oxyCODONE 5 MG immediate release tablet Commonly known as: Oxy IR/ROXICODONE Take by mouth.   Ozempic (0.25 or 0.5 MG/DOSE) 2 MG/3ML Sopn Generic drug: Semaglutide(0.25 or 0.5MG /DOS) Inject 0.5 mg into the skin once a week.   Pen Needles 32G X 4 MM Misc Use as instructed to inject once a week.   promethazine-dextromethorphan 6.25-15 MG/5ML syrup Commonly known as: PROMETHAZINE-DM Take by mouth.   risperiDONE 2 MG tablet Commonly known as: RISPERDAL Take 2 mg by mouth once.   sertraline 100 MG tablet Commonly known as: ZOLOFT Take 100 mg by  mouth daily. 2 tablets by mouth daily   SUMAtriptan 100 MG tablet Commonly known as: IMITREX Take 100 mg by mouth every 4 (four) hours as needed for migraine.   traMADol 50 MG tablet Commonly known as: ULTRAM Take by mouth.   trimethoprim 100 MG tablet Commonly known as: TRIMPEX Take 100 mg by mouth 2 (two) times daily.   Vitamin D (Ergocalciferol) 1.25 MG (50000 UNIT) Caps capsule Commonly known as: DRISDOL Take 50,000 Units by mouth once a week.        No past medical history on file.  No past surgical history on file.  Family History  Problem Relation Age of Onset   Diabetes Mother    Diabetes Maternal Grandfather     Social History:  reports that she quit smoking about 24 years ago. Her smoking use included cigarettes.  She has never used smokeless tobacco. No history on file for alcohol use and drug use.  Allergies:  Allergies  Allergen Reactions   Levaquin [Levofloxacin In D5w] Itching     Review of systems:  RENAL dysfunction: Creatinine has been variable, generally improved now  She has history of recurrent UTI  Lab Results  Component Value Date   CREATININE 1.27 (H) 04/02/2023   CREATININE 1.12 (H) 10/22/2022   CREATININE 1.57 (H) 06/20/2022    HYPERLIPIDEMIA: She has had high LDL, high triglycerides as also LDL particle number  and low HDL.   Previously her particle number was excellent at 555  She was told to discontinue her atorvastatin since LDL was only 48 previously, subsequently it was still below 100  She is on Lovaza She is also on Colestid for diarrhea  Previously on fenofibrate  Also has persistently low HDL, has not had any labs done through PCP   Lab Results  Component Value Date   CHOL 145 06/20/2022   CHOL 129 11/13/2021   CHOL 99 (L) 07/06/2021   Lab Results  Component Value Date   HDL 33 (L) 06/20/2022   HDL 33 (L) 11/13/2021   HDL 33 (L) 07/06/2021   Lab Results  Component Value Date   LDLCALC 93 06/20/2022    LDLCALC 74 11/13/2021   LDLCALC 48 07/06/2021   Lab Results  Component Value Date   TRIG 100 06/20/2022   TRIG 124 11/13/2021   TRIG 94 07/06/2021   Lab Results  Component Value Date   CHOLHDL 4.4 06/20/2022   CHOLHDL 3.9 11/13/2021   CHOLHDL 3.0 07/06/2021   No results found for: "LDLDIRECT"   Lab Results  Component Value Date   ALT 18 06/20/2022     GI: 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  There is no height or weight on file to calculate BMI.    Assesment/PLAN: 1/24  Diabetes type 2, mild and currently on glipizide and Ozempic 0.25 mg weekly  Her A1c is 5.7 compared to 5.9  Blood sugars are fairly good as judged by her A1c but she is monitoring sporadically and did not get many readings from her meter by recall Again she has not increased her Ozempic and has gained weight  Plan: Since she is having diarrhea/vomiting currently she will wait till she is better to start on the Ozempic She will then take 0.25 mg for 4 weeks and then go to 0.5 mg Continue to get the Ozempic for the patient assistance program Consider stopping glipizide if blood sugars are more consistently below 90 She will need to watch her carbohydrates and avoid regular soft drinks   Mild renal dysfunction: Stable  LIPIDS: Previously well-controlled  3 months follow-up with fasting labs including lipids, chemistry And A1c  Reather Littler 04/22/2023, 10:51 AM

## 2023-04-28 DIAGNOSIS — S42202A Unspecified fracture of upper end of left humerus, initial encounter for closed fracture: Secondary | ICD-10-CM | POA: Diagnosis not present

## 2023-04-30 DIAGNOSIS — Z09 Encounter for follow-up examination after completed treatment for conditions other than malignant neoplasm: Secondary | ICD-10-CM | POA: Diagnosis not present

## 2023-04-30 DIAGNOSIS — D179 Benign lipomatous neoplasm, unspecified: Secondary | ICD-10-CM | POA: Diagnosis not present

## 2023-05-01 DIAGNOSIS — K080 Exfoliation of teeth due to systemic causes: Secondary | ICD-10-CM | POA: Diagnosis not present

## 2023-05-02 DIAGNOSIS — N3020 Other chronic cystitis without hematuria: Secondary | ICD-10-CM | POA: Diagnosis not present

## 2023-05-02 DIAGNOSIS — R3121 Asymptomatic microscopic hematuria: Secondary | ICD-10-CM | POA: Diagnosis not present

## 2023-05-02 DIAGNOSIS — N3281 Overactive bladder: Secondary | ICD-10-CM | POA: Diagnosis not present

## 2023-05-05 DIAGNOSIS — M5416 Radiculopathy, lumbar region: Secondary | ICD-10-CM | POA: Diagnosis not present

## 2023-05-05 DIAGNOSIS — M47896 Other spondylosis, lumbar region: Secondary | ICD-10-CM | POA: Diagnosis not present

## 2023-05-07 DIAGNOSIS — F3341 Major depressive disorder, recurrent, in partial remission: Secondary | ICD-10-CM | POA: Diagnosis not present

## 2023-05-07 DIAGNOSIS — F909 Attention-deficit hyperactivity disorder, unspecified type: Secondary | ICD-10-CM | POA: Diagnosis not present

## 2023-05-15 DIAGNOSIS — F331 Major depressive disorder, recurrent, moderate: Secondary | ICD-10-CM | POA: Diagnosis not present

## 2023-05-20 DIAGNOSIS — I10 Essential (primary) hypertension: Secondary | ICD-10-CM | POA: Diagnosis not present

## 2023-05-20 DIAGNOSIS — Z01810 Encounter for preprocedural cardiovascular examination: Secondary | ICD-10-CM | POA: Diagnosis not present

## 2023-05-23 DIAGNOSIS — R109 Unspecified abdominal pain: Secondary | ICD-10-CM | POA: Diagnosis not present

## 2023-05-23 DIAGNOSIS — R1084 Generalized abdominal pain: Secondary | ICD-10-CM | POA: Diagnosis not present

## 2023-05-23 DIAGNOSIS — K831 Obstruction of bile duct: Secondary | ICD-10-CM | POA: Diagnosis not present

## 2023-05-26 DIAGNOSIS — M5416 Radiculopathy, lumbar region: Secondary | ICD-10-CM | POA: Diagnosis not present

## 2023-05-28 DIAGNOSIS — K080 Exfoliation of teeth due to systemic causes: Secondary | ICD-10-CM | POA: Diagnosis not present

## 2023-06-03 DIAGNOSIS — H353131 Nonexudative age-related macular degeneration, bilateral, early dry stage: Secondary | ICD-10-CM | POA: Diagnosis not present

## 2023-06-03 LAB — HM DIABETES EYE EXAM

## 2023-06-06 DIAGNOSIS — G4733 Obstructive sleep apnea (adult) (pediatric): Secondary | ICD-10-CM | POA: Diagnosis not present

## 2023-06-11 ENCOUNTER — Encounter: Payer: Self-pay | Admitting: Endocrinology

## 2023-06-17 ENCOUNTER — Telehealth: Payer: Self-pay | Admitting: Endocrinology

## 2023-06-17 DIAGNOSIS — M5416 Radiculopathy, lumbar region: Secondary | ICD-10-CM | POA: Diagnosis not present

## 2023-06-17 NOTE — Telephone Encounter (Signed)
Patient need refill on her Glipizide 2.5 mg till she sees Dr. Erroll Luna in October / Call into [facility] in [place]

## 2023-06-18 ENCOUNTER — Other Ambulatory Visit: Payer: Self-pay

## 2023-06-18 ENCOUNTER — Other Ambulatory Visit: Payer: Self-pay | Admitting: *Deleted

## 2023-06-18 DIAGNOSIS — M51369 Other intervertebral disc degeneration, lumbar region without mention of lumbar back pain or lower extremity pain: Secondary | ICD-10-CM

## 2023-06-18 DIAGNOSIS — M5416 Radiculopathy, lumbar region: Secondary | ICD-10-CM

## 2023-06-18 DIAGNOSIS — E1165 Type 2 diabetes mellitus with hyperglycemia: Secondary | ICD-10-CM

## 2023-06-18 DIAGNOSIS — M47896 Other spondylosis, lumbar region: Secondary | ICD-10-CM

## 2023-06-18 DIAGNOSIS — M5106 Intervertebral disc disorders with myelopathy, lumbar region: Secondary | ICD-10-CM

## 2023-06-18 DIAGNOSIS — M5136 Other intervertebral disc degeneration, lumbar region: Secondary | ICD-10-CM

## 2023-06-18 MED ORDER — GLIPIZIDE ER 2.5 MG PO TB24
2.5000 mg | ORAL_TABLET | Freq: Every day | ORAL | 1 refills | Status: DC
Start: 2023-06-18 — End: 2023-07-30

## 2023-06-18 NOTE — Telephone Encounter (Signed)
Glipizide was refilled until patient can see Dr Erroll Luna in October

## 2023-06-22 DIAGNOSIS — E119 Type 2 diabetes mellitus without complications: Secondary | ICD-10-CM | POA: Diagnosis not present

## 2023-07-01 DIAGNOSIS — M25472 Effusion, left ankle: Secondary | ICD-10-CM | POA: Diagnosis not present

## 2023-07-01 DIAGNOSIS — E119 Type 2 diabetes mellitus without complications: Secondary | ICD-10-CM | POA: Diagnosis not present

## 2023-07-01 DIAGNOSIS — L84 Corns and callosities: Secondary | ICD-10-CM | POA: Diagnosis not present

## 2023-07-01 DIAGNOSIS — K080 Exfoliation of teeth due to systemic causes: Secondary | ICD-10-CM | POA: Diagnosis not present

## 2023-07-01 DIAGNOSIS — M25471 Effusion, right ankle: Secondary | ICD-10-CM | POA: Diagnosis not present

## 2023-07-10 DIAGNOSIS — F331 Major depressive disorder, recurrent, moderate: Secondary | ICD-10-CM | POA: Diagnosis not present

## 2023-07-22 DIAGNOSIS — E119 Type 2 diabetes mellitus without complications: Secondary | ICD-10-CM | POA: Diagnosis not present

## 2023-07-24 DIAGNOSIS — M7062 Trochanteric bursitis, left hip: Secondary | ICD-10-CM | POA: Diagnosis not present

## 2023-07-24 DIAGNOSIS — M87052 Idiopathic aseptic necrosis of left femur: Secondary | ICD-10-CM | POA: Diagnosis not present

## 2023-07-24 DIAGNOSIS — M87051 Idiopathic aseptic necrosis of right femur: Secondary | ICD-10-CM | POA: Diagnosis not present

## 2023-07-27 DIAGNOSIS — R070 Pain in throat: Secondary | ICD-10-CM | POA: Diagnosis not present

## 2023-07-27 DIAGNOSIS — J3489 Other specified disorders of nose and nasal sinuses: Secondary | ICD-10-CM | POA: Diagnosis not present

## 2023-07-27 DIAGNOSIS — R509 Fever, unspecified: Secondary | ICD-10-CM | POA: Diagnosis not present

## 2023-07-27 DIAGNOSIS — B349 Viral infection, unspecified: Secondary | ICD-10-CM | POA: Diagnosis not present

## 2023-07-27 DIAGNOSIS — R300 Dysuria: Secondary | ICD-10-CM | POA: Diagnosis not present

## 2023-07-28 ENCOUNTER — Other Ambulatory Visit: Payer: Self-pay

## 2023-07-28 DIAGNOSIS — E119 Type 2 diabetes mellitus without complications: Secondary | ICD-10-CM

## 2023-07-28 MED ORDER — ONETOUCH VERIO VI STRP
ORAL_STRIP | 2 refills | Status: DC
Start: 2023-07-28 — End: 2024-04-29

## 2023-07-30 ENCOUNTER — Encounter: Payer: Self-pay | Admitting: Endocrinology

## 2023-07-30 ENCOUNTER — Ambulatory Visit: Payer: Medicare Other | Admitting: Endocrinology

## 2023-07-30 VITALS — BP 120/80 | HR 87 | Ht 64.0 in | Wt 179.8 lb

## 2023-07-30 DIAGNOSIS — Z7984 Long term (current) use of oral hypoglycemic drugs: Secondary | ICD-10-CM | POA: Diagnosis not present

## 2023-07-30 DIAGNOSIS — E1165 Type 2 diabetes mellitus with hyperglycemia: Secondary | ICD-10-CM | POA: Diagnosis not present

## 2023-07-30 LAB — BASIC METABOLIC PANEL
BUN: 27 mg/dL — ABNORMAL HIGH (ref 6–23)
CO2: 26 meq/L (ref 19–32)
Calcium: 9.2 mg/dL (ref 8.4–10.5)
Chloride: 106 meq/L (ref 96–112)
Creatinine, Ser: 1.25 mg/dL — ABNORMAL HIGH (ref 0.40–1.20)
GFR: 43.95 mL/min — ABNORMAL LOW (ref >60.00)
Glucose, Bld: 164 mg/dL — ABNORMAL HIGH (ref 70–99)
Potassium: 4.3 meq/L (ref 3.5–5.1)
Sodium: 142 meq/L (ref 135–145)

## 2023-07-30 LAB — LIPID PANEL
Cholesterol: 138 mg/dL (ref 0–200)
HDL: 28.5 mg/dL — ABNORMAL LOW (ref >39.00)
LDL Cholesterol: 88 mg/dL (ref 0–99)
NonHDL: 109.52
Total CHOL/HDL Ratio: 5
Triglycerides: 106 mg/dL (ref 0.0–149.0)
VLDL: 21.2 mg/dL (ref 0.0–40.0)

## 2023-07-30 LAB — CBC
HCT: 38.5 % (ref 36.0–46.0)
Hemoglobin: 12.2 g/dL (ref 12.0–15.0)
MCHC: 31.7 g/dL (ref 30.0–36.0)
MCV: 86.4 fL (ref 78.0–100.0)
Platelets: 152 10*3/uL (ref 150.0–400.0)
RBC: 4.46 Mil/uL (ref 3.87–5.11)
RDW: 16.1 % — ABNORMAL HIGH (ref 11.5–15.5)
WBC: 6.1 10*3/uL (ref 4.0–10.5)

## 2023-07-30 LAB — POCT GLYCOSYLATED HEMOGLOBIN (HGB A1C): Hemoglobin A1C: 6.2 % — AB (ref 4.0–5.6)

## 2023-07-30 LAB — MICROALBUMIN / CREATININE URINE RATIO
Creatinine,U: 187.3 mg/dL
Microalb Creat Ratio: 0.4 mg/g (ref 0.0–30.0)
Microalb, Ur: <0.7 mg/dL (ref 0.0–1.9)

## 2023-07-30 MED ORDER — GLIPIZIDE ER 2.5 MG PO TB24
5.0000 mg | ORAL_TABLET | Freq: Every day | ORAL | 3 refills | Status: DC
Start: 2023-07-30 — End: 2023-10-30

## 2023-07-30 NOTE — Patient Instructions (Signed)
Glipizide XR 2.77m g daily.   Take 2 tabs of 2.5mg  when on steroid injection for 2 weeks.

## 2023-07-30 NOTE — Progress Notes (Signed)
Outpatient Endocrinology Note [place] Perseus Westall, MD  07/30/23  Patient's Name: Kristin Olson    DOB: Jan 29, 1956    MRN: [id number]                                                    REASON OF VISIT: Follow-up of type 2 diabetes mellitus  PCP: Hadley Pen, MD  HISTORY OF PRESENT ILLNESS:   Kristin Olson is a 67 y.o. old female with past medical history listed below, is here for follow up of type 2 diabetes mellitus.   Pertinent Diabetes History: Patient was diagnosed with type 2 diabetes in 1999.  She had mild diabetes for several years had well-controlled with metformin alone in the past.  Chronic Diabetes Complications : Retinopathy: no. Last ophthalmology exam was done on annually repeatedly.  She is following with ophthalmology for macular degeneration. Nephropathy: CKD IIIa Peripheral neuropathy: no.  On gabapentin. Coronary artery disease: no Stroke: no  Relevant comorbidities and cardiovascular risk factors: Obesity: yes Body mass index is 30.86 kg/m.  Hypertension: yes Hyperlipidemia. Yes, on statin.   Current / Home Diabetic regimen includes: Glipizide extended release 2.5 mg daily.  Prior diabetic medications: Ozempic 2.5 mg weekly tried, had nausea and vomiting and is stopped. Metformin was stopped due to diarrhea. Victoza and Byetta tried in the past. She has history of recurrent UTI.  Glycemic data:   She has been using Livongo meter.  Not able to download in the clinic today.  Blood sugar reviewed directly from the meter as follows.  Before breakfast : 124, 111, 123 and after meals lunch/supper : 278, 155, 259, 246, 170, 123, 152,    Hypoglycemia: Patient has no hypoglycemic episodes. Patient has hypoglycemia awareness.  Factors modifying glucose control: 1.  Diabetic diet assessment: 3 meals a day.  2.  Staying active or exercising: No formal exercise.  3.  Medication compliance: compliant all of the time.  Interval history 07/30/23 Patient is no  longer taking Ozempic he went on 2.5 mg weekly due to nausea and vomiting.  She had steroid injection on the hip last Thursday and has noticed elevated blood sugar.  She denies numbness and tingling of the feet.  No other complaints today.  Glucometer data as reviewed above.  REVIEW OF SYSTEMS As per history of present illness.   PAST MEDICAL HISTORY: History reviewed. No pertinent past medical history.  PAST SURGICAL HISTORY: History reviewed. No pertinent surgical history.  ALLERGIES: Allergies  Allergen Reactions   Levaquin [Levofloxacin In D5w] Itching    FAMILY HISTORY:  Family History  Problem Relation Age of Onset   Diabetes Mother    Diabetes Maternal Grandfather     SOCIAL HISTORY: Social History   Socioeconomic History   Marital status: Married    Spouse name: Not on file   Number of children: Not on file   Years of education: Not on file   Highest education level: Not on file  Occupational History   Not on file  Tobacco Use   Smoking status: Former    Current packs/day: 0.00    Types: Cigarettes    Quit date: 11/20/1998    Years since quitting: 24.7   Smokeless tobacco: Never  Substance and Sexual Activity   Alcohol use: Not on file   Drug use: Not on file   Sexual  activity: Not on file  Other Topics Concern   Not on file  Social History Narrative   Not on file   Social Determinants of Health   Financial Resource Strain: Not on file  Food Insecurity: Low Risk  (05/22/2023)   Received from [facility]   Hunger Vital Sign    Worried About Running Out of Food in the Last Year: Never true    Ran Out of Food in the Last Year: Never true  Transportation Needs: Not on file (05/22/2023)  Physical Activity: Not on file  Stress: Not on file  Social Connections: Not on file    MEDICATIONS:  Current Outpatient Medications  Medication Sig Dispense Refill   colestipol (COLESTID) 1 g tablet Take 1 g by mouth 2 (two) times daily.     diazepam (VALIUM) 5 MG  tablet Take 5 mg by mouth 3 (three) times daily.     diltiazem (CARDIZEM CD) 120 MG 24 hr capsule Take 120 mg by mouth daily.     estradiol (ESTRACE) 0.1 MG/GM vaginal cream SMARTSIG:Sparingly Vaginal 3 Times a Week     furosemide (LASIX) 20 MG tablet TAKE 1 TABLET(20 MG) BY MOUTH DAILY AS NEEDED     gabapentin (NEURONTIN) 400 MG capsule Take 400 mg by mouth 2 (two) times daily.     HYDROcodone-acetaminophen (NORCO/VICODIN) 5-325 MG tablet Take 1 tablet by mouth daily.     mirtazapine (REMERON) 7.5 MG tablet Take 2 mg by mouth at bedtime.     omega-3 acid ethyl esters (LOVAZA) 1 g capsule TAKE 2 CAPSULES BY MOUTH TWICE DAILY 360 capsule 2   omeprazole (PRILOSEC) 20 MG capsule 40 mg.      ondansetron (ZOFRAN-ODT) 8 MG disintegrating tablet DISSOLVE 1 TABLET(8 MG) ON THE TONGUE EVERY 8 HOURS AS NEEDED FOR NAUSEA     oxybutynin (DITROPAN XL) 15 MG 24 hr tablet Take 15 mg by mouth 2 (two) times daily.      risperiDONE (RISPERDAL) 2 MG tablet Take 2 mg by mouth once.     sertraline (ZOLOFT) 100 MG tablet Take 100 mg by mouth daily. 2 tablets by mouth daily     SUMAtriptan (IMITREX) 100 MG tablet Take 100 mg by mouth every 4 (four) hours as needed for migraine.     trimethoprim (TRIMPEX) 100 MG tablet Take 100 mg by mouth 2 (two) times daily.     atorvastatin (LIPITOR) 10 MG tablet Take 1 tablet (10 mg total) by mouth daily. (Patient not taking: Reported on 10/31/2022) 90 tablet 1   Blood Glucose Monitoring Suppl (ONETOUCH VERIO REFLECT) w/Device KIT Use to check blood sugar daily (Patient not taking: Reported on 07/30/2023) 1 kit 0   cephALEXin (KEFLEX) 500 MG capsule Take 500 mg by mouth 4 (four) times daily. (Patient not taking: Reported on 07/30/2023)     glipiZIDE (GLUCOTROL XL) 2.5 MG 24 hr tablet Take 2 tablets (5 mg total) by mouth daily with breakfast. 180 tablet 3   glucose blood (ONETOUCH VERIO) test strip USE  STRIP TO CHECK GLUCOSE TWICE DAILY (Patient not taking: Reported on 07/30/2023) 200  strip 2   Insulin Pen Needle (PEN NEEDLES) 32G X 4 MM MISC Use as instructed to inject once a week. (Patient not taking: Reported on 04/22/2023) 30 each 1   ondansetron (ZOFRAN-ODT) 4 MG disintegrating tablet DISSOLVE 1 TABLET(4 MG) ON THE TONGUE EVERY 8 HOURS AS NEEDED FOR NAUSEA     OneTouch Delica Lancets 30G MISC 1 each by Other route  2 (two) times daily. Use as instructed to check blood sugar twice daily.DX:E11.9 (Patient not taking: Reported on 07/30/2023) 200 each 2   oxyCODONE (OXY IR/ROXICODONE) 5 MG immediate release tablet Take by mouth. (Patient not taking: Reported on 07/30/2023)     promethazine-dextromethorphan (PROMETHAZINE-DM) 6.25-15 MG/5ML syrup Take by mouth. (Patient not taking: Reported on 07/30/2023)     traMADol (ULTRAM) 50 MG tablet Take by mouth. (Patient not taking: Reported on 07/30/2023)     Vitamin D, Ergocalciferol, (DRISDOL) 1.25 MG (50000 UNIT) CAPS capsule Take 50,000 Units by mouth once a week. (Patient not taking: Reported on 07/30/2023)     No current facility-administered medications for this visit.    PHYSICAL EXAM: Vitals:   07/30/23 0823  BP: 120/80  Pulse: 87  SpO2: 99%  Weight: 179 lb 12.8 oz (81.6 kg)  Height: 5\' 4"  (1.626 m)   Body mass index is 30.86 kg/m.  Wt Readings from Last 3 Encounters:  07/30/23 179 lb 12.8 oz (81.6 kg)  01/15/23 175 lb (79.4 kg)  10/31/22 176 lb 3.2 oz (79.9 kg)    General: Well developed, well nourished female in no apparent distress.  HEENT: AT/[place], no external lesions.  Eyes: Conjunctiva clear and no icterus. Neck: Neck supple  Lungs: Respirations not labored Neurologic: Alert, oriented, normal speech Extremities / Skin: Dry. No sores or rashes noted. No acanthosis nigricans Psychiatric: Does not appear depressed or anxious  Diabetic Foot Exam - Simple   Simple Foot Form Diabetic Foot exam was performed with the following findings: Yes 07/30/2023  9:03 AM  Visual Inspection No deformities, no ulcerations, no  other skin breakdown bilaterally: Yes See comments: Yes Sensation Testing Intact to touch and monofilament testing bilaterally: Yes Pulse Check Posterior Tibialis and Dorsalis pulse intact bilaterally: Yes Comments Callus present bilaterally.      LABS Reviewed Lab Results  Component Value Date   HGBA1C 6.2 (A) 07/30/2023   HGBA1C 5.7 (H) 04/02/2023   HGBA1C 5.9 (H) 10/22/2022   No results found for: "FRUCTOSAMINE" Lab Results  Component Value Date   CHOL 145 06/20/2022   HDL 33 (L) 06/20/2022   LDLCALC 93 06/20/2022   TRIG 100 06/20/2022   CHOLHDL 4.4 06/20/2022   Lab Results  Component Value Date   MICRALBCREAT 4 06/20/2022   MICRALBCREAT <5 11/13/2021   Lab Results  Component Value Date   CREATININE 1.27 (H) 04/02/2023   No results found for: "GFR"  ASSESSMENT / PLAN  1. Type 2 diabetes mellitus with hyperglycemia, without long-term current use of insulin (HCC)   2. Uncontrolled type 2 diabetes mellitus with hyperglycemia, without long-term current use of insulin (HCC)     Diabetes Mellitus type 2, complicated by CKD. - Diabetic status / severity: Controlled.  Lab Results  Component Value Date   HGBA1C 6.2 (A) 07/30/2023    - Hemoglobin A1c goal : <7%  - Medications: See below.  I) continue glipizide extended release 2.5 mg weekly.  Take 2 tablets / 5 mg glipizide extended release in case of steroid injection for 2 weeks.  - Home glucose testing: In the morning fasting and at bedtime. - Discussed/ Gave Hypoglycemia treatment plan.  # Consult : not required at this time.   # Annual urine for microalbuminuria/ creatinine ratio, no microalbuminuria currently, will check today. Last  Lab Results  Component Value Date   MICRALBCREAT 4 06/20/2022    # Foot check nightly.  # Annual dilated diabetic eye exams.   - Diet: Make  healthy diabetic food choices - Life style / activity / exercise: Discussed.  2. Blood pressure  -  BP Readings from  Last 1 Encounters:  07/30/23 120/80    - Control is in target.  - No change in current plans.  3. Lipid status / Hyperlipidemia - Last  Lab Results  Component Value Date   LDLCALC 93 06/20/2022   - Will check lipid panel today.  She used to be on fenofibrate in the past.  Currently on Lovaza. ?  Not taking atorvastatin.  Previously managed by primary care provider.  Labs today I would like to check CBC as well for the hemoglobin.  Will check lipid panel, BMP, urine microalbumin and creatinine ratio.  Diagnoses and all orders for this visit:  Type 2 diabetes mellitus with hyperglycemia, without long-term current use of insulin (HCC) -     POCT glycosylated hemoglobin (Hb A1C)  Uncontrolled type 2 diabetes mellitus with hyperglycemia, without long-term current use of insulin (HCC) -     glipiZIDE (GLUCOTROL XL) 2.5 MG 24 hr tablet; Take 2 tablets (5 mg total) by mouth daily with breakfast. -     Microalbumin / creatinine urine ratio; Future -     Lipid panel; Future -     Basic metabolic panel; Future -     CBC; Future -     CBC -     Basic metabolic panel -     Lipid panel -     Microalbumin / creatinine urine ratio    DISPOSITION Follow up in clinic in 3 months suggested.  Patient prefers MyChart visit.  Patient is asked to fax Livongo glucometer data prior to follow-up visit in 3 months.   All questions answered and patient verbalized understanding of the plan.  [place] Eshani Maestre, MD [facility] Endocrinology [facility] [address] [place], [address] [place], [place] [address] Phone # [phone]  At least part of this note was generated using voice recognition software. Inadvertent word errors may have occurred, which were not recognized during the proofreading process.

## 2023-07-31 ENCOUNTER — Encounter: Payer: Self-pay | Admitting: *Deleted

## 2023-08-02 ENCOUNTER — Ambulatory Visit
Admission: RE | Admit: 2023-08-02 | Discharge: 2023-08-02 | Disposition: A | Discharge status: Home / Self Care | Payer: Medicare Other | Source: Ambulatory Visit | Attending: *Deleted | Admitting: *Deleted

## 2023-08-02 DIAGNOSIS — M51369 Other intervertebral disc degeneration, lumbar region without mention of lumbar back pain or lower extremity pain: Secondary | ICD-10-CM

## 2023-08-02 DIAGNOSIS — M47896 Other spondylosis, lumbar region: Secondary | ICD-10-CM

## 2023-08-02 DIAGNOSIS — M5416 Radiculopathy, lumbar region: Secondary | ICD-10-CM

## 2023-08-02 DIAGNOSIS — M5106 Intervertebral disc disorders with myelopathy, lumbar region: Secondary | ICD-10-CM

## 2023-08-02 DIAGNOSIS — M48061 Spinal stenosis, lumbar region without neurogenic claudication: Secondary | ICD-10-CM | POA: Diagnosis not present

## 2023-08-02 DIAGNOSIS — Z981 Arthrodesis status: Secondary | ICD-10-CM | POA: Diagnosis not present

## 2023-08-02 MED ORDER — GADOPICLENOL 0.5 MMOL/ML IV SOLN
8.0000 mL | Freq: Once | INTRAVENOUS | Status: AC | PRN
  Administered 2023-08-02: 8 mL via INTRAVENOUS

## 2023-08-04 DIAGNOSIS — M25512 Pain in left shoulder: Secondary | ICD-10-CM | POA: Diagnosis not present

## 2023-08-06 DIAGNOSIS — F331 Major depressive disorder, recurrent, moderate: Secondary | ICD-10-CM | POA: Diagnosis not present

## 2023-08-07 DIAGNOSIS — M5416 Radiculopathy, lumbar region: Secondary | ICD-10-CM | POA: Diagnosis not present

## 2023-08-07 DIAGNOSIS — M48062 Spinal stenosis, lumbar region with neurogenic claudication: Secondary | ICD-10-CM | POA: Diagnosis not present

## 2023-08-07 DIAGNOSIS — M532X6 Spinal instabilities, lumbar region: Secondary | ICD-10-CM | POA: Diagnosis not present

## 2023-08-07 DIAGNOSIS — M4316 Spondylolisthesis, lumbar region: Secondary | ICD-10-CM | POA: Diagnosis not present

## 2023-08-13 DIAGNOSIS — M25572 Pain in left ankle and joints of left foot: Secondary | ICD-10-CM | POA: Diagnosis not present

## 2023-08-13 DIAGNOSIS — M25571 Pain in right ankle and joints of right foot: Secondary | ICD-10-CM | POA: Diagnosis not present

## 2023-08-15 DIAGNOSIS — M48062 Spinal stenosis, lumbar region with neurogenic claudication: Secondary | ICD-10-CM | POA: Diagnosis not present

## 2023-08-18 DIAGNOSIS — Z1231 Encounter for screening mammogram for malignant neoplasm of breast: Secondary | ICD-10-CM | POA: Diagnosis not present

## 2023-08-19 DIAGNOSIS — S93402A Sprain of unspecified ligament of left ankle, initial encounter: Secondary | ICD-10-CM | POA: Diagnosis not present

## 2023-08-19 DIAGNOSIS — S93401A Sprain of unspecified ligament of right ankle, initial encounter: Secondary | ICD-10-CM | POA: Diagnosis not present

## 2023-08-22 DIAGNOSIS — E119 Type 2 diabetes mellitus without complications: Secondary | ICD-10-CM | POA: Diagnosis not present

## 2023-09-01 DIAGNOSIS — S93402A Sprain of unspecified ligament of left ankle, initial encounter: Secondary | ICD-10-CM | POA: Diagnosis not present

## 2023-09-01 DIAGNOSIS — S93401A Sprain of unspecified ligament of right ankle, initial encounter: Secondary | ICD-10-CM | POA: Diagnosis not present

## 2023-09-03 DIAGNOSIS — K591 Functional diarrhea: Secondary | ICD-10-CM | POA: Diagnosis not present

## 2023-09-03 DIAGNOSIS — R159 Full incontinence of feces: Secondary | ICD-10-CM | POA: Diagnosis not present

## 2023-09-04 DIAGNOSIS — F331 Major depressive disorder, recurrent, moderate: Secondary | ICD-10-CM | POA: Diagnosis not present

## 2023-09-09 DIAGNOSIS — H353131 Nonexudative age-related macular degeneration, bilateral, early dry stage: Secondary | ICD-10-CM | POA: Diagnosis not present

## 2023-09-16 DIAGNOSIS — G4733 Obstructive sleep apnea (adult) (pediatric): Secondary | ICD-10-CM | POA: Diagnosis not present

## 2023-09-21 DIAGNOSIS — E119 Type 2 diabetes mellitus without complications: Secondary | ICD-10-CM | POA: Diagnosis not present

## 2023-09-22 DIAGNOSIS — Z01810 Encounter for preprocedural cardiovascular examination: Secondary | ICD-10-CM | POA: Diagnosis not present

## 2023-09-23 DIAGNOSIS — Z01419 Encounter for gynecological examination (general) (routine) without abnormal findings: Secondary | ICD-10-CM | POA: Diagnosis not present

## 2023-09-23 DIAGNOSIS — R87610 Atypical squamous cells of undetermined significance on cytologic smear of cervix (ASC-US): Secondary | ICD-10-CM | POA: Diagnosis not present

## 2023-09-23 DIAGNOSIS — Z124 Encounter for screening for malignant neoplasm of cervix: Secondary | ICD-10-CM | POA: Diagnosis not present

## 2023-09-29 DIAGNOSIS — Z9889 Other specified postprocedural states: Secondary | ICD-10-CM | POA: Diagnosis not present

## 2023-09-29 DIAGNOSIS — M48062 Spinal stenosis, lumbar region with neurogenic claudication: Secondary | ICD-10-CM | POA: Diagnosis not present

## 2023-09-29 DIAGNOSIS — M4316 Spondylolisthesis, lumbar region: Secondary | ICD-10-CM | POA: Diagnosis not present

## 2023-09-29 DIAGNOSIS — M4726 Other spondylosis with radiculopathy, lumbar region: Secondary | ICD-10-CM | POA: Diagnosis not present

## 2023-09-29 DIAGNOSIS — Z79899 Other long term (current) drug therapy: Secondary | ICD-10-CM | POA: Diagnosis not present

## 2023-09-29 DIAGNOSIS — Z981 Arthrodesis status: Secondary | ICD-10-CM | POA: Diagnosis not present

## 2023-09-29 DIAGNOSIS — M5416 Radiculopathy, lumbar region: Secondary | ICD-10-CM | POA: Diagnosis not present

## 2023-09-29 DIAGNOSIS — M532X6 Spinal instabilities, lumbar region: Secondary | ICD-10-CM | POA: Diagnosis not present

## 2023-09-30 ENCOUNTER — Other Ambulatory Visit: Payer: Self-pay

## 2023-09-30 DIAGNOSIS — Z79899 Other long term (current) drug therapy: Secondary | ICD-10-CM | POA: Diagnosis not present

## 2023-09-30 DIAGNOSIS — Z9889 Other specified postprocedural states: Secondary | ICD-10-CM | POA: Diagnosis not present

## 2023-09-30 DIAGNOSIS — M4726 Other spondylosis with radiculopathy, lumbar region: Secondary | ICD-10-CM | POA: Diagnosis not present

## 2023-09-30 DIAGNOSIS — Z4789 Encounter for other orthopedic aftercare: Secondary | ICD-10-CM | POA: Diagnosis not present

## 2023-09-30 DIAGNOSIS — M4316 Spondylolisthesis, lumbar region: Secondary | ICD-10-CM | POA: Diagnosis not present

## 2023-09-30 DIAGNOSIS — Z981 Arthrodesis status: Secondary | ICD-10-CM | POA: Diagnosis not present

## 2023-09-30 DIAGNOSIS — M48062 Spinal stenosis, lumbar region with neurogenic claudication: Secondary | ICD-10-CM | POA: Diagnosis not present

## 2023-10-02 DIAGNOSIS — F331 Major depressive disorder, recurrent, moderate: Secondary | ICD-10-CM | POA: Diagnosis not present

## 2023-10-02 DIAGNOSIS — G8929 Other chronic pain: Secondary | ICD-10-CM | POA: Diagnosis not present

## 2023-10-02 DIAGNOSIS — F3342 Major depressive disorder, recurrent, in full remission: Secondary | ICD-10-CM | POA: Diagnosis not present

## 2023-10-21 DIAGNOSIS — L84 Corns and callosities: Secondary | ICD-10-CM | POA: Diagnosis not present

## 2023-10-21 DIAGNOSIS — E119 Type 2 diabetes mellitus without complications: Secondary | ICD-10-CM | POA: Diagnosis not present

## 2023-10-21 DIAGNOSIS — Z7984 Long term (current) use of oral hypoglycemic drugs: Secondary | ICD-10-CM | POA: Diagnosis not present

## 2023-10-22 DIAGNOSIS — E119 Type 2 diabetes mellitus without complications: Secondary | ICD-10-CM | POA: Diagnosis not present

## 2023-10-23 DIAGNOSIS — H353131 Nonexudative age-related macular degeneration, bilateral, early dry stage: Secondary | ICD-10-CM | POA: Diagnosis not present

## 2023-10-27 DIAGNOSIS — M48062 Spinal stenosis, lumbar region with neurogenic claudication: Secondary | ICD-10-CM | POA: Diagnosis not present

## 2023-10-30 ENCOUNTER — Telehealth: Payer: Self-pay

## 2023-10-30 ENCOUNTER — Telehealth: Payer: Medicare Other | Admitting: Endocrinology

## 2023-10-30 ENCOUNTER — Other Ambulatory Visit: Payer: Self-pay

## 2023-10-30 ENCOUNTER — Encounter: Payer: Self-pay | Admitting: Endocrinology

## 2023-10-30 DIAGNOSIS — Z7985 Long-term (current) use of injectable non-insulin antidiabetic drugs: Secondary | ICD-10-CM

## 2023-10-30 DIAGNOSIS — E1165 Type 2 diabetes mellitus with hyperglycemia: Secondary | ICD-10-CM | POA: Diagnosis not present

## 2023-10-30 MED ORDER — LOVASTATIN 20 MG PO TABS: 20.0000 mg | ORAL_TABLET | Freq: Every day | ORAL | 3 refills | Status: DC

## 2023-10-30 MED ORDER — GLIPIZIDE ER 2.5 MG PO TB24: 5.0000 mg | ORAL_TABLET | Freq: Every day | ORAL | 3 refills | Status: DC

## 2023-10-30 NOTE — Telephone Encounter (Signed)
 Patient requested labs be mailed to her in case [facility] has an issue retrieving them. Lab orders mailed to patient's home address on file.

## 2023-10-30 NOTE — Progress Notes (Signed)
 I connected with  Kristin Olson on 10/30/23 by a video enabled telemedicine application and verified that I am speaking with the correct person using two identifiers.   I discussed the limitations of evaluation and management by telemedicine. The patient expressed understanding and agreed to proceed.   Provider location : Office Patient location: Home  Outpatient Endocrinology Note Kristin Rappa, MD  10/30/23  Patient's Name: Kristin Olson    DOB: [birth year]    MRN: [id number]                                                    REASON OF VISIT: Follow-up of type 2 diabetes mellitus  PCP: Silver Lamar LABOR, MD  HISTORY OF PRESENT ILLNESS:   Kristin Olson is a 68 y.o. old female with past medical history listed below, is here for follow up of type 2 diabetes mellitus.   Pertinent Diabetes History: Patient was diagnosed with type 2 diabetes in 1999.  She had mild diabetes for several years had well-controlled with metformin  alone in the past.  Chronic Diabetes Complications : Retinopathy: no. Last ophthalmology exam was done on annually repeatedly.  She is following with ophthalmology for macular degeneration. Nephropathy: CKD IIIa Peripheral neuropathy: no.  On gabapentin. Coronary artery disease: no Stroke: no  Relevant comorbidities and cardiovascular risk factors: Obesity: yes There is no height or weight on file to calculate BMI.  Hypertension: yes Hyperlipidemia. Yes, on statin.   Current / Home Diabetic regimen includes:  Glipizide  extended release 5 mg daily.  Prior diabetic medications: Ozempic  2.5 mg weekly tried, had nausea and vomiting and is stopped. Metformin  was stopped due to diarrhea. Victoza and Byetta tried in the past. She has history of recurrent UTI.  Glycemic data:   She has been using [employer] meter.  Fax glucose data reviewed.  Glucose data from October 2 to October 20, 2023 received and reviewed.  Mostly acceptable fasting blood sugar and  postprandial blood sugar especially at bedtime up to the range of 180-200 and occasionally in the low 200s.  No hypoglycemia. Some of the fasting blood sugar 150, 158, 115, 100, 149, 179, 119.  Blood sugar in the afternoon 165, 114, 163, 138.  Blood sugar at bedtime 222, 2025, 169, 179, 170.  No hypoglycemia.  Hypoglycemia: Patient has no hypoglycemic episodes. Patient has hypoglycemia awareness.  Factors modifying glucose control: 1.  Diabetic diet assessment: 3 meals a day.  2.  Staying active or exercising: No formal exercise.  3.  Medication compliance: compliant all of the time.  Interval history Patient reports she had back surgery/lumbar fusion surgery in beginning of December, and recovering well.  Close data as reviewed above.  Mostly acceptable blood sugar.  She has been taking glipizide  extended release 2.5 mg 2 tablets daily.  She has not received steroid injection for a long time last injection was in October.  No other complaints today.  Today's virtual video visit.  REVIEW OF SYSTEMS As per history of present illness.   PAST MEDICAL HISTORY: History reviewed. No pertinent past medical history.  PAST SURGICAL HISTORY: History reviewed. No pertinent surgical history.  ALLERGIES: Allergies  Allergen Reactions   Levaquin [Levofloxacin In D5w] Itching    FAMILY HISTORY:  Family History  Problem Relation Age of Onset   Diabetes Mother    Diabetes Maternal Grandfather  SOCIAL HISTORY: Social History   Socioeconomic History   Marital status: Married    Spouse name: Not on file   Number of children: Not on file   Years of education: Not on file   Highest education level: Not on file  Occupational History   Not on file  Tobacco Use   Smoking status: Former    Current packs/day: 0.00    Types: Cigarettes    Quit date: 11/20/1998    Years since quitting: 24.9   Smokeless tobacco: Never  Substance and Sexual Activity   Alcohol use: Not on file   Drug  use: Not on file   Sexual activity: Not on file  Other Topics Concern   Not on file  Social History Narrative   Not on file   Social Drivers of Health   Financial Resource Strain: Not on file  Food Insecurity: Low Risk  (09/29/2023)   Received from [facility]   Hunger Vital Sign    Worried About Running Out of Food in the Last Year: Never true    Ran Out of Food in the Last Year: Never true  Transportation Needs: No Transportation Needs (09/29/2023)   Received from [facility]    In the past 12 months, has lack of reliable transportation kept you from medical appointments, meetings, work or from getting things needed for daily living? : No  Physical Activity: Not on file  Stress: Not on file  Social Connections: Not on file    MEDICATIONS:  Current Outpatient Medications  Medication Sig Dispense Refill   lovastatin  (MEVACOR ) 20 MG tablet Take 1 tablet (20 mg total) by mouth at bedtime. 90 tablet 3   Blood Glucose Monitoring Suppl (ONETOUCH VERIO REFLECT) w/Device KIT Use to check blood sugar daily (Patient not taking: Reported on 07/30/2023) 1 kit 0   cephALEXin (KEFLEX) 500 MG capsule Take 500 mg by mouth 4 (four) times daily. (Patient not taking: Reported on 07/30/2023)     colestipol (COLESTID) 1 g tablet Take 1 g by mouth 2 (two) times daily.     diazepam (VALIUM) 5 MG tablet Take 5 mg by mouth 3 (three) times daily.     diltiazem (CARDIZEM CD) 120 MG 24 hr capsule Take 120 mg by mouth daily.     estradiol (ESTRACE) 0.1 MG/GM vaginal cream SMARTSIG:Sparingly Vaginal 3 Times a Week     furosemide (LASIX) 20 MG tablet TAKE 1 TABLET(20 MG) BY MOUTH DAILY AS NEEDED     gabapentin (NEURONTIN) 400 MG capsule Take 400 mg by mouth 2 (two) times daily.     glipiZIDE  (GLUCOTROL  XL) 2.5 MG 24 hr tablet Take 2 tablets (5 mg total) by mouth daily with breakfast. 180 tablet 3   glucose blood (ONETOUCH VERIO) test strip USE  STRIP TO CHECK GLUCOSE TWICE DAILY (Patient not  taking: Reported on 07/30/2023) 200 strip 2   HYDROcodone-acetaminophen  (NORCO/VICODIN) 5-325 MG tablet Take 1 tablet by mouth daily.     Insulin Pen Needle (PEN NEEDLES) 32G X 4 MM MISC Use as instructed to inject once a week. (Patient not taking: Reported on 04/22/2023) 30 each 1   mirtazapine (REMERON) 7.5 MG tablet Take 2 mg by mouth at bedtime.     omega-3 acid ethyl esters (LOVAZA ) 1 g capsule TAKE 2 CAPSULES BY MOUTH TWICE DAILY 360 capsule 2   omeprazole (PRILOSEC) 20 MG capsule 40 mg.      ondansetron  (ZOFRAN -ODT) 4 MG disintegrating tablet DISSOLVE 1 TABLET(4  MG) ON THE TONGUE EVERY 8 HOURS AS NEEDED FOR NAUSEA     ondansetron  (ZOFRAN -ODT) 8 MG disintegrating tablet DISSOLVE 1 TABLET(8 MG) ON THE TONGUE EVERY 8 HOURS AS NEEDED FOR NAUSEA     OneTouch Delica Lancets 30G MISC 1 each by Other route 2 (two) times daily. Use as instructed to check blood sugar twice daily.DX:E11.9 (Patient not taking: Reported on 07/30/2023) 200 each 2   oxybutynin (DITROPAN XL) 15 MG 24 hr tablet Take 15 mg by mouth 2 (two) times daily.      oxyCODONE (OXY IR/ROXICODONE) 5 MG immediate release tablet Take by mouth. (Patient not taking: Reported on 07/30/2023)     promethazine-dextromethorphan (PROMETHAZINE-DM) 6.25-15 MG/5ML syrup Take by mouth. (Patient not taking: Reported on 07/30/2023)     risperiDONE (RISPERDAL) 2 MG tablet Take 2 mg by mouth once.     sertraline (ZOLOFT) 100 MG tablet Take 100 mg by mouth daily. 2 tablets by mouth daily     SUMAtriptan (IMITREX) 100 MG tablet Take 100 mg by mouth every 4 (four) hours as needed for migraine.     traMADol (ULTRAM) 50 MG tablet Take by mouth. (Patient not taking: Reported on 07/30/2023)     trimethoprim (TRIMPEX) 100 MG tablet Take 100 mg by mouth 2 (two) times daily.     Vitamin D, Ergocalciferol, (DRISDOL) 1.25 MG (50000 UNIT) CAPS capsule Take 50,000 Units by mouth once a week. (Patient not taking: Reported on 07/30/2023)     No current facility-administered  medications for this visit.    PHYSICAL EXAM: There were no vitals filed for this visit.  There is no height or weight on file to calculate BMI.  Wt Readings from Last 3 Encounters:  07/30/23 179 lb 12.8 oz (81.6 kg)  01/15/23 175 lb (79.4 kg)  10/31/22 176 lb 3.2 oz (79.9 kg)    General: Well developed, well nourished female in no apparent distress.  HEENT: AT/[place], no external lesions.  Neurologic: Alert, oriented, normal speech Psychiatric: Does not appear depressed or anxious  Diabetic Foot Exam - Simple   No data filed     LABS Reviewed Lab Results  Component Value Date   HGBA1C 6.2 (A) 07/30/2023   HGBA1C 5.7 (H) 04/02/2023   HGBA1C 5.9 (H) 10/22/2022   No results found for: FRUCTOSAMINE Lab Results  Component Value Date   CHOL 138 07/30/2023   HDL 28.50 (L) 07/30/2023   LDLCALC 88 07/30/2023   TRIG 106.0 07/30/2023   CHOLHDL 5 07/30/2023   Lab Results  Component Value Date   MICRALBCREAT 0.4 07/30/2023   MICRALBCREAT 4 06/20/2022   Lab Results  Component Value Date   CREATININE 1.25 (H) 07/30/2023   Lab Results  Component Value Date   GFR 43.95 (L) 07/30/2023    ASSESSMENT / PLAN  1. Type 2 diabetes mellitus with hyperglycemia, without long-term current use of insulin (HCC)    Diabetes Mellitus type 2, complicated by CKD. - Diabetic status / severity: Controlled.  Lab Results  Component Value Date   HGBA1C 6.2 (A) 07/30/2023    - Hemoglobin A1c goal : <7%  Mostly acceptable blood sugar on glucometer.  She has mild hyperglycemia mostly at bedtime, discussed about portion control with supper, avoiding snacks and high carbohydrate/sugary meals.  - Medications: See below.  No change.  I) continue glipizide  extended release 5 mg daily.    - Home glucose testing: In the morning fasting and at bedtime. - Discussed/ Gave Hypoglycemia treatment plan.  #  Consult : not required at this time.   # Annual urine for microalbuminuria/ creatinine  ratio, no microalbuminuria currently. Last  Lab Results  Component Value Date   MICRALBCREAT 0.4 07/30/2023    # Foot check nightly.  # Annual dilated diabetic eye exams.   - Diet: Make healthy diabetic food choices - Life style / activity / exercise: Discussed.  2. Blood pressure  -  BP Readings from Last 1 Encounters:  07/30/23 120/80    - Control is in target.  - No change in current plans.  3. Lipid status / Hyperlipidemia - Last  Lab Results  Component Value Date   LDLCALC 88 07/30/2023   -Patient reports Lovaza  is not covered and insurance gives alternative for lovastatin .  Order for lovastatin  20 mg daily sent.  She is no longer taking atorvastatin .  Check lab for BMP and hemoglobin A1c, she will complete lab at local [facility] and fax for results.   Diagnoses and all orders for this visit:  Type 2 diabetes mellitus with hyperglycemia, without long-term current use of insulin (HCC) -     glipiZIDE  (GLUCOTROL  XL) 2.5 MG 24 hr tablet; Take 2 tablets (5 mg total) by mouth daily with breakfast. -     Cancel: Basic Metabolic Panel (BMET) -     Cancel: Hemoglobin A1c  Other orders -     lovastatin  (MEVACOR ) 20 MG tablet; Take 1 tablet (20 mg total) by mouth at bedtime.   DISPOSITION Follow up in clinic in 4 months suggested.  Patient prefers MyChart visit.  Patient is asked to fax [employer] glucometer data prior to follow-up visit in 4 months.   All questions answered and patient verbalized understanding of the plan.  Kristin Arvidson, MD [facility] Endocrinology [facility] [address], [address] [place], [place] [address] Phone # [phone]  At least part of this note was generated using voice recognition software. Inadvertent word errors may have occurred, which were not recognized during the proofreading process.

## 2023-10-31 DIAGNOSIS — E782 Mixed hyperlipidemia: Secondary | ICD-10-CM | POA: Diagnosis not present

## 2023-10-31 DIAGNOSIS — Z87891 Personal history of nicotine dependence: Secondary | ICD-10-CM | POA: Diagnosis not present

## 2023-11-05 DIAGNOSIS — F3341 Major depressive disorder, recurrent, in partial remission: Secondary | ICD-10-CM | POA: Diagnosis not present

## 2023-11-06 DIAGNOSIS — G4733 Obstructive sleep apnea (adult) (pediatric): Secondary | ICD-10-CM | POA: Diagnosis not present

## 2023-11-17 DIAGNOSIS — K080 Exfoliation of teeth due to systemic causes: Secondary | ICD-10-CM | POA: Diagnosis not present

## 2023-11-22 DIAGNOSIS — E119 Type 2 diabetes mellitus without complications: Secondary | ICD-10-CM | POA: Diagnosis not present

## 2023-12-01 DIAGNOSIS — K080 Exfoliation of teeth due to systemic causes: Secondary | ICD-10-CM | POA: Diagnosis not present

## 2023-12-12 DIAGNOSIS — M48062 Spinal stenosis, lumbar region with neurogenic claudication: Secondary | ICD-10-CM | POA: Diagnosis not present

## 2023-12-12 DIAGNOSIS — G894 Chronic pain syndrome: Secondary | ICD-10-CM | POA: Diagnosis not present

## 2023-12-12 DIAGNOSIS — Z79899 Other long term (current) drug therapy: Secondary | ICD-10-CM | POA: Diagnosis not present

## 2023-12-12 DIAGNOSIS — Z79891 Long term (current) use of opiate analgesic: Secondary | ICD-10-CM | POA: Diagnosis not present

## 2023-12-20 DIAGNOSIS — E119 Type 2 diabetes mellitus without complications: Secondary | ICD-10-CM | POA: Diagnosis not present

## 2023-12-24 DIAGNOSIS — H26492 Other secondary cataract, left eye: Secondary | ICD-10-CM | POA: Diagnosis not present

## 2024-01-05 DIAGNOSIS — G4733 Obstructive sleep apnea (adult) (pediatric): Secondary | ICD-10-CM | POA: Diagnosis not present

## 2024-01-07 DIAGNOSIS — M5416 Radiculopathy, lumbar region: Secondary | ICD-10-CM | POA: Diagnosis not present

## 2024-01-07 DIAGNOSIS — M4326 Fusion of spine, lumbar region: Secondary | ICD-10-CM | POA: Diagnosis not present

## 2024-01-09 DIAGNOSIS — K080 Exfoliation of teeth due to systemic causes: Secondary | ICD-10-CM | POA: Diagnosis not present

## 2024-01-20 DIAGNOSIS — E119 Type 2 diabetes mellitus without complications: Secondary | ICD-10-CM | POA: Diagnosis not present

## 2024-01-30 DIAGNOSIS — M4326 Fusion of spine, lumbar region: Secondary | ICD-10-CM | POA: Diagnosis not present

## 2024-02-04 DIAGNOSIS — E119 Type 2 diabetes mellitus without complications: Secondary | ICD-10-CM | POA: Diagnosis not present

## 2024-02-04 LAB — HM DIABETES EYE EXAM

## 2024-02-05 DIAGNOSIS — F331 Major depressive disorder, recurrent, moderate: Secondary | ICD-10-CM | POA: Diagnosis not present

## 2024-02-17 DIAGNOSIS — R0981 Nasal congestion: Secondary | ICD-10-CM | POA: Diagnosis not present

## 2024-02-17 DIAGNOSIS — R051 Acute cough: Secondary | ICD-10-CM | POA: Diagnosis not present

## 2024-02-17 DIAGNOSIS — R300 Dysuria: Secondary | ICD-10-CM | POA: Diagnosis not present

## 2024-02-19 DIAGNOSIS — E119 Type 2 diabetes mellitus without complications: Secondary | ICD-10-CM | POA: Diagnosis not present

## 2024-02-19 DIAGNOSIS — G4733 Obstructive sleep apnea (adult) (pediatric): Secondary | ICD-10-CM | POA: Diagnosis not present

## 2024-02-23 ENCOUNTER — Telehealth: Payer: Self-pay | Admitting: Endocrinology

## 2024-02-23 NOTE — Telephone Encounter (Signed)
 Patient is requesting confirmation that her labs have been sent to [facility] on [address] in [place].  Please call to confirm at [phone] - patient is going to [facility] this Wednesday 05.07.25

## 2024-02-24 DIAGNOSIS — Z7984 Long term (current) use of oral hypoglycemic drugs: Secondary | ICD-10-CM | POA: Diagnosis not present

## 2024-02-24 DIAGNOSIS — L84 Corns and callosities: Secondary | ICD-10-CM | POA: Diagnosis not present

## 2024-02-24 DIAGNOSIS — E119 Type 2 diabetes mellitus without complications: Secondary | ICD-10-CM | POA: Diagnosis not present

## 2024-02-24 DIAGNOSIS — Z7985 Long-term (current) use of injectable non-insulin antidiabetic drugs: Secondary | ICD-10-CM | POA: Diagnosis not present

## 2024-02-25 DIAGNOSIS — E1165 Type 2 diabetes mellitus with hyperglycemia: Secondary | ICD-10-CM | POA: Diagnosis not present

## 2024-02-26 ENCOUNTER — Encounter: Payer: Self-pay | Admitting: Endocrinology

## 2024-02-26 LAB — BASIC METABOLIC PANEL WITH GFR
BUN/Creatinine Ratio: 11 — ABNORMAL LOW (ref 12–28)
BUN: 17 mg/dL (ref 8–27)
CO2: 17 mmol/L — ABNORMAL LOW (ref 20–29)
Calcium: 9.2 mg/dL (ref 8.7–10.3)
Chloride: 102 mmol/L (ref 96–106)
Creatinine, Ser: 1.49 mg/dL — ABNORMAL HIGH (ref 0.57–1.00)
Glucose: 138 mg/dL — ABNORMAL HIGH (ref 70–99)
Potassium: 4.5 mmol/L (ref 3.5–5.2)
Sodium: 139 mmol/L (ref 134–144)
eGFR: 38 mL/min/{1.73_m2} — ABNORMAL LOW (ref >59)

## 2024-02-26 LAB — HEMOGLOBIN A1C
Est. average glucose Bld gHb Est-mCnc: 126 mg/dL
Hgb A1c MFr Bld: 6 % — ABNORMAL HIGH (ref 4.8–5.6)

## 2024-02-27 ENCOUNTER — Ambulatory Visit: Payer: Medicare Other | Admitting: Endocrinology

## 2024-03-04 DIAGNOSIS — F331 Major depressive disorder, recurrent, moderate: Secondary | ICD-10-CM | POA: Diagnosis not present

## 2024-03-16 ENCOUNTER — Telehealth: Payer: Medicare Other | Admitting: Endocrinology

## 2024-03-16 ENCOUNTER — Encounter: Payer: Self-pay | Admitting: Endocrinology

## 2024-03-16 DIAGNOSIS — E1165 Type 2 diabetes mellitus with hyperglycemia: Secondary | ICD-10-CM

## 2024-03-16 DIAGNOSIS — E785 Hyperlipidemia, unspecified: Secondary | ICD-10-CM

## 2024-03-16 DIAGNOSIS — I10 Essential (primary) hypertension: Secondary | ICD-10-CM

## 2024-03-16 DIAGNOSIS — Z7984 Long term (current) use of oral hypoglycemic drugs: Secondary | ICD-10-CM

## 2024-03-16 MED ORDER — GLIPIZIDE ER 2.5 MG PO TB24: 5.0000 mg | ORAL_TABLET | Freq: Every day | ORAL | 3 refills | Status: AC

## 2024-03-16 NOTE — Progress Notes (Signed)
 I connected with  Kristin Olson on 03/16/24 by a video enabled telemedicine application and verified that I am speaking with the correct person using two identifiers.   I discussed the limitations of evaluation and management by telemedicine. The patient expressed understanding and agreed to proceed.   Provider location : Office Patient location: Home  Outpatient Endocrinology Note Kristin Averill Winters, MD  03/16/24  Patient's Name: Kristin Olson    DOB: [birth year]    MRN: [id number]                                                    REASON OF VISIT: Follow-up of type 2 diabetes mellitus  PCP: Melva Stabile, MD  HISTORY OF PRESENT ILLNESS:   Kristin Olson is a 68 y.o. old female with past medical history listed below, is here for follow up of type 2 diabetes mellitus.   Pertinent Diabetes History: Patient was diagnosed with type 2 diabetes in 1999.  She had mild diabetes for several years had well-controlled with metformin  alone in the past.  Chronic Diabetes Complications : Retinopathy: no. Last ophthalmology exam was done on annually repeatedly.  She is following with ophthalmology for macular degeneration. Nephropathy: CKD IIIa Peripheral neuropathy: no.  On gabapentin. Coronary artery disease: no Stroke: no  Relevant comorbidities and cardiovascular risk factors: Obesity: yes There is no height or weight on file to calculate BMI.  Hypertension: yes Hyperlipidemia. Yes, on statin.   Current / Home Diabetic regimen includes:  Glipizide  extended release 2.5 mg 2 tabs daily.  Prior diabetic medications: Ozempic  2.5 mg weekly tried, had nausea and vomiting and is stopped. Metformin  was stopped due to diarrhea. Victoza and Byetta tried in the past. She has history of recurrent UTI.  Glycemic data:   Patient sent Liv[employer] meter website link, not able to download, invalid link.  Patient reports she is occasionally having low blood sugar in 50s and 60s-yesterday afternoon and  last last afternoon.  Hypoglycemia: Patient has minor hypoglycemic episodes. Patient has hypoglycemia awareness.  Factors modifying glucose control: 1.  Diabetic diet assessment: 3 meals a day.  2.  Staying active or exercising: No formal exercise.  3.  Medication compliance: compliant all of the time.  Interval history No glucose data to review.  Patient reports he had occasional low blood sugar-yesterday afternoon and last Thursday, in the late afternoon blood sugar in 50s and 60s.  She reports he has not been eating enough, she may have a UTI and taking ?  Antibiotic, also having nausea.  Being managed by primary care provider.  Recent hemoglobin A1c 6%.  She has been taking glipizide  2.5 mg 2 tablets daily.  No other complaints today.  Today's virtual video visit.  REVIEW OF SYSTEMS As per history of present illness.   PAST MEDICAL HISTORY: History reviewed. No pertinent past medical history.  PAST SURGICAL HISTORY: History reviewed. No pertinent surgical history.  ALLERGIES: Allergies  Allergen Reactions   Levaquin [Levofloxacin In D5w] Itching    FAMILY HISTORY:  Family History  Problem Relation Age of Onset   Diabetes Mother    Diabetes Maternal Grandfather     SOCIAL HISTORY: Social History   Socioeconomic History   Marital status: Married    Spouse name: Not on file   Number of children: Not on file   Years of  education: Not on file   Highest education level: Not on file  Occupational History   Not on file  Tobacco Use   Smoking status: Former    Current packs/day: 0.00    Types: Cigarettes    Quit date: 11/20/1998    Years since quitting: 25.3   Smokeless tobacco: Never  Substance and Sexual Activity   Alcohol use: Not on file   Drug use: Not on file   Sexual activity: Not on file  Other Topics Concern   Not on file  Social History Narrative   Not on file   Social Drivers of Health   Financial Resource Strain: Not on file  Food  Insecurity: Low Risk  (09/29/2023)   Received from [facility]   Hunger Vital Sign    Worried About Running Out of Food in the Last Year: Never true    Ran Out of Food in the Last Year: Never true  Transportation Needs: No Transportation Needs (09/29/2023)   Received from [facility]    In the past 12 months, has lack of reliable transportation kept you from medical appointments, meetings, work or from getting things needed for daily living? : No  Physical Activity: Not on file  Stress: Not on file  Social Connections: Not on file    MEDICATIONS:  Current Outpatient Medications  Medication Sig Dispense Refill   Blood Glucose Monitoring Suppl (ONETOUCH VERIO REFLECT) w/Device KIT Use to check blood sugar daily (Patient not taking: Reported on 07/30/2023) 1 kit 0   cephALEXin (KEFLEX) 500 MG capsule Take 500 mg by mouth 4 (four) times daily. (Patient not taking: Reported on 07/30/2023)     colestipol (COLESTID) 1 g tablet Take 1 g by mouth 2 (two) times daily.     diazepam (VALIUM) 5 MG tablet Take 5 mg by mouth 3 (three) times daily.     diltiazem (CARDIZEM CD) 120 MG 24 hr capsule Take 120 mg by mouth daily.     estradiol (ESTRACE) 0.1 MG/GM vaginal cream SMARTSIG:Sparingly Vaginal 3 Times a Week     furosemide (LASIX) 20 MG tablet TAKE 1 TABLET(20 MG) BY MOUTH DAILY AS NEEDED     gabapentin (NEURONTIN) 400 MG capsule Take 400 mg by mouth 2 (two) times daily.     glipiZIDE  (GLUCOTROL  XL) 2.5 MG 24 hr tablet Take 2 tablets (5 mg total) by mouth daily with breakfast. 180 tablet 3   glucose blood (ONETOUCH VERIO) test strip USE  STRIP TO CHECK GLUCOSE TWICE DAILY (Patient not taking: Reported on 07/30/2023) 200 strip 2   HYDROcodone-acetaminophen (NORCO/VICODIN) 5-325 MG tablet Take 1 tablet by mouth daily.     Insulin Pen Needle (PEN NEEDLES) 32G X 4 MM MISC Use as instructed to inject once a week. (Patient not taking: Reported on 04/22/2023) 30 each 1   lovastatin  (MEVACOR )  20 MG tablet Take 1 tablet (20 mg total) by mouth at bedtime. 90 tablet 3   mirtazapine (REMERON) 7.5 MG tablet Take 2 mg by mouth at bedtime.     omega-3 acid ethyl esters (LOVAZA ) 1 g capsule TAKE 2 CAPSULES BY MOUTH TWICE DAILY 360 capsule 2   omeprazole (PRILOSEC) 20 MG capsule 40 mg.      ondansetron (ZOFRAN-ODT) 4 MG disintegrating tablet DISSOLVE 1 TABLET(4 MG) ON THE TONGUE EVERY 8 HOURS AS NEEDED FOR NAUSEA     ondansetron (ZOFRAN-ODT) 8 MG disintegrating tablet DISSOLVE 1 TABLET(8 MG) ON THE TONGUE EVERY 8 HOURS AS NEEDED  FOR NAUSEA     OneTouch Delica Lancets 30G MISC 1 each by Other route 2 (two) times daily. Use as instructed to check blood sugar twice daily.DX:E11.9 (Patient not taking: Reported on 07/30/2023) 200 each 2   oxybutynin (DITROPAN XL) 15 MG 24 hr tablet Take 15 mg by mouth 2 (two) times daily.      oxyCODONE (OXY IR/ROXICODONE) 5 MG immediate release tablet Take by mouth. (Patient not taking: Reported on 07/30/2023)     promethazine-dextromethorphan (PROMETHAZINE-DM) 6.25-15 MG/5ML syrup Take by mouth. (Patient not taking: Reported on 07/30/2023)     risperiDONE (RISPERDAL) 2 MG tablet Take 2 mg by mouth once.     sertraline (ZOLOFT) 100 MG tablet Take 100 mg by mouth daily. 2 tablets by mouth daily     SUMAtriptan (IMITREX) 100 MG tablet Take 100 mg by mouth every 4 (four) hours as needed for migraine.     traMADol (ULTRAM) 50 MG tablet Take by mouth. (Patient not taking: Reported on 07/30/2023)     trimethoprim (TRIMPEX) 100 MG tablet Take 100 mg by mouth 2 (two) times daily.     Vitamin D, Ergocalciferol, (DRISDOL) 1.25 MG (50000 UNIT) CAPS capsule Take 50,000 Units by mouth once a week. (Patient not taking: Reported on 07/30/2023)     No current facility-administered medications for this visit.    PHYSICAL EXAM: There were no vitals filed for this visit.  There is no height or weight on file to calculate BMI.  Wt Readings from Last 3 Encounters:  07/30/23 179 lb 12.8  oz (81.6 kg)  01/15/23 175 lb (79.4 kg)  10/31/22 176 lb 3.2 oz (79.9 kg)    General: Well developed, well nourished female in no apparent distress.  HEENT: AT/[place], no external lesions.  Neurologic: Alert, oriented, normal speech Psychiatric: Does not appear depressed or anxious  Diabetic Foot Exam - Simple   No data filed     LABS Reviewed Lab Results  Component Value Date   HGBA1C 6.0 (H) 02/25/2024   HGBA1C 6.2 (A) 07/30/2023   HGBA1C 5.7 (H) 04/02/2023   No results found for: "FRUCTOSAMINE" Lab Results  Component Value Date   CHOL 138 07/30/2023   HDL 28.50 (L) 07/30/2023   LDLCALC 88 07/30/2023   TRIG 106.0 07/30/2023   CHOLHDL 5 07/30/2023   Lab Results  Component Value Date   MICRALBCREAT 0.4 07/30/2023   MICRALBCREAT 4 06/20/2022   Lab Results  Component Value Date   CREATININE 1.49 (H) 02/25/2024   Lab Results  Component Value Date   GFR 43.95 (L) 07/30/2023    ASSESSMENT / PLAN  1. Type 2 diabetes mellitus with hyperglycemia, without long-term current use of insulin (HCC)     Diabetes Mellitus type 2, complicated by CKD. - Diabetic status / severity: Controlled.  Lab Results  Component Value Date   HGBA1C 6.0 (H) 02/25/2024    - Hemoglobin A1c goal : 6.5%  No glucose data to review.  Patient reports occasional hypoglycemia in last 1 week due to not eating enough.  Asked patient to send us  a MyChart message actual [employer] glucose data download file or fax to our clinic.  Will review glucose data unavailable.  Asked patient to take glipizide  2.5 mg 1 tablet daily when not eating dinner.  When start eating regular meals okay to take glipizide  2 tablets daily which would be 5 mg daily.  - Medications: See below.  No change.  I) continue glipizide  extended release 5 mg daily.    -  Home glucose testing: In the morning fasting and at bedtime. - Discussed/ Gave Hypoglycemia treatment plan.  # Consult : not required at this time.   # Annual  urine for microalbuminuria/ creatinine ratio, no microalbuminuria currently. Last  Lab Results  Component Value Date   MICRALBCREAT 0.4 07/30/2023    # Foot check nightly.  # Annual dilated diabetic eye exams.   - Diet: Make healthy diabetic food choices - Life style / activity / exercise: Discussed.  2. Blood pressure  -  BP Readings from Last 1 Encounters:  07/30/23 120/80    - Control is in target.  - No change in current plans.  3. Lipid status / Hyperlipidemia - Last  Lab Results  Component Value Date   LDLCALC 88 07/30/2023   -Patient reports Lovaza  is not covered and insurance gives alternative for lovastatin .  Lovastatin  20 mg daily.  Check lab for BMP, lipid panel, urine microalbumin creatinine ratio and hemoglobin A1c, she will complete lab at local [facility] and fax for results.   Diagnoses and all orders for this visit:  Type 2 diabetes mellitus with hyperglycemia, without long-term current use of insulin (HCC) -     glipiZIDE  (GLUCOTROL  XL) 2.5 MG 24 hr tablet; Take 2 tablets (5 mg total) by mouth daily with breakfast.    DISPOSITION Follow up in clinic in 4 months suggested.  Patient prefers MyChart visit.  Patient is asked to fax or attach actual download file into MyChart message [employer] glucometer data prior to follow-up visit in 4 months.   All questions answered and patient verbalized understanding of the plan.  Kristin Andrell Bergeson, MD [facility] Endocrinology [facility] [address] [place], [address] [place], [place] [address] Phone # [phone]  At least part of this note was generated using voice recognition software. Inadvertent word errors may have occurred, which were not recognized during the proofreading process.

## 2024-03-18 DIAGNOSIS — F331 Major depressive disorder, recurrent, moderate: Secondary | ICD-10-CM | POA: Diagnosis not present

## 2024-03-21 DIAGNOSIS — E119 Type 2 diabetes mellitus without complications: Secondary | ICD-10-CM | POA: Diagnosis not present

## 2024-03-24 ENCOUNTER — Ambulatory Visit: Payer: Self-pay | Admitting: Endocrinology

## 2024-04-01 DIAGNOSIS — F331 Major depressive disorder, recurrent, moderate: Secondary | ICD-10-CM | POA: Diagnosis not present

## 2024-04-14 DIAGNOSIS — F331 Major depressive disorder, recurrent, moderate: Secondary | ICD-10-CM | POA: Diagnosis not present

## 2024-04-20 DIAGNOSIS — E119 Type 2 diabetes mellitus without complications: Secondary | ICD-10-CM | POA: Diagnosis not present

## 2024-04-28 DIAGNOSIS — F3341 Major depressive disorder, recurrent, in partial remission: Secondary | ICD-10-CM | POA: Diagnosis not present

## 2024-04-28 DIAGNOSIS — F909 Attention-deficit hyperactivity disorder, unspecified type: Secondary | ICD-10-CM | POA: Diagnosis not present

## 2024-04-29 ENCOUNTER — Other Ambulatory Visit: Payer: Self-pay

## 2024-04-29 DIAGNOSIS — E119 Type 2 diabetes mellitus without complications: Secondary | ICD-10-CM

## 2024-04-29 MED ORDER — ONETOUCH VERIO VI STRP: ORAL_STRIP | 2 refills | Status: AC

## 2024-04-30 DIAGNOSIS — M5416 Radiculopathy, lumbar region: Secondary | ICD-10-CM | POA: Diagnosis not present

## 2024-04-30 DIAGNOSIS — M48062 Spinal stenosis, lumbar region with neurogenic claudication: Secondary | ICD-10-CM | POA: Diagnosis not present

## 2024-04-30 DIAGNOSIS — Z1330 Encounter for screening examination for mental health and behavioral disorders, unspecified: Secondary | ICD-10-CM | POA: Diagnosis not present

## 2024-04-30 DIAGNOSIS — M791 Myalgia, unspecified site: Secondary | ICD-10-CM | POA: Diagnosis not present

## 2024-05-21 DIAGNOSIS — E119 Type 2 diabetes mellitus without complications: Secondary | ICD-10-CM | POA: Diagnosis not present

## 2024-06-08 DIAGNOSIS — F331 Major depressive disorder, recurrent, moderate: Secondary | ICD-10-CM | POA: Diagnosis not present

## 2024-06-21 DIAGNOSIS — E119 Type 2 diabetes mellitus without complications: Secondary | ICD-10-CM | POA: Diagnosis not present

## 2024-06-23 DIAGNOSIS — G4733 Obstructive sleep apnea (adult) (pediatric): Secondary | ICD-10-CM | POA: Diagnosis not present

## 2024-06-28 ENCOUNTER — Telehealth: Payer: Self-pay

## 2024-06-28 DIAGNOSIS — E1165 Type 2 diabetes mellitus with hyperglycemia: Secondary | ICD-10-CM

## 2024-06-28 NOTE — Telephone Encounter (Signed)
 Patient requested labs be sent to [facility]. Labs faxed

## 2024-06-29 ENCOUNTER — Telehealth: Payer: Self-pay

## 2024-06-29 NOTE — Telephone Encounter (Signed)
 Faxed labs x 3 with no answer to all, patient made aware via Mychart.

## 2024-07-01 DIAGNOSIS — S90111A Contusion of right great toe without damage to nail, initial encounter: Secondary | ICD-10-CM | POA: Diagnosis not present

## 2024-07-01 DIAGNOSIS — E119 Type 2 diabetes mellitus without complications: Secondary | ICD-10-CM | POA: Diagnosis not present

## 2024-07-01 DIAGNOSIS — M19071 Primary osteoarthritis, right ankle and foot: Secondary | ICD-10-CM | POA: Diagnosis not present

## 2024-07-02 DIAGNOSIS — M791 Myalgia, unspecified site: Secondary | ICD-10-CM | POA: Diagnosis not present

## 2024-07-02 DIAGNOSIS — M5442 Lumbago with sciatica, left side: Secondary | ICD-10-CM | POA: Diagnosis not present

## 2024-07-02 DIAGNOSIS — Z981 Arthrodesis status: Secondary | ICD-10-CM | POA: Diagnosis not present

## 2024-07-02 DIAGNOSIS — G8929 Other chronic pain: Secondary | ICD-10-CM | POA: Diagnosis not present

## 2024-07-03 DIAGNOSIS — G8929 Other chronic pain: Secondary | ICD-10-CM | POA: Diagnosis not present

## 2024-07-08 DIAGNOSIS — N3281 Overactive bladder: Secondary | ICD-10-CM | POA: Diagnosis not present

## 2024-07-08 DIAGNOSIS — N3944 Nocturnal enuresis: Secondary | ICD-10-CM | POA: Diagnosis not present

## 2024-07-08 DIAGNOSIS — N3020 Other chronic cystitis without hematuria: Secondary | ICD-10-CM | POA: Diagnosis not present

## 2024-07-19 ENCOUNTER — Telehealth: Admitting: Endocrinology

## 2024-07-20 DIAGNOSIS — S90111D Contusion of right great toe without damage to nail, subsequent encounter: Secondary | ICD-10-CM | POA: Diagnosis not present

## 2024-07-20 DIAGNOSIS — E119 Type 2 diabetes mellitus without complications: Secondary | ICD-10-CM | POA: Diagnosis not present

## 2024-07-20 DIAGNOSIS — M19071 Primary osteoarthritis, right ankle and foot: Secondary | ICD-10-CM | POA: Diagnosis not present

## 2024-07-22 DIAGNOSIS — N183 Chronic kidney disease, stage 3 unspecified: Secondary | ICD-10-CM | POA: Diagnosis not present

## 2024-07-22 DIAGNOSIS — Z23 Encounter for immunization: Secondary | ICD-10-CM | POA: Diagnosis not present

## 2024-07-22 DIAGNOSIS — E538 Deficiency of other specified B group vitamins: Secondary | ICD-10-CM | POA: Diagnosis not present

## 2024-07-22 DIAGNOSIS — Z0000 Encounter for general adult medical examination without abnormal findings: Secondary | ICD-10-CM | POA: Diagnosis not present

## 2024-07-22 DIAGNOSIS — E1122 Type 2 diabetes mellitus with diabetic chronic kidney disease: Secondary | ICD-10-CM | POA: Diagnosis not present

## 2024-07-22 DIAGNOSIS — Z13220 Encounter for screening for lipoid disorders: Secondary | ICD-10-CM | POA: Diagnosis not present

## 2024-07-22 DIAGNOSIS — E119 Type 2 diabetes mellitus without complications: Secondary | ICD-10-CM | POA: Diagnosis not present

## 2024-07-22 DIAGNOSIS — E559 Vitamin D deficiency, unspecified: Secondary | ICD-10-CM | POA: Diagnosis not present

## 2024-07-22 DIAGNOSIS — K080 Exfoliation of teeth due to systemic causes: Secondary | ICD-10-CM | POA: Diagnosis not present

## 2024-08-03 DIAGNOSIS — H353131 Nonexudative age-related macular degeneration, bilateral, early dry stage: Secondary | ICD-10-CM | POA: Diagnosis not present

## 2024-08-09 ENCOUNTER — Telehealth: Admitting: Endocrinology

## 2024-08-12 DIAGNOSIS — H353132 Nonexudative age-related macular degeneration, bilateral, intermediate dry stage: Secondary | ICD-10-CM | POA: Diagnosis not present

## 2024-08-25 DIAGNOSIS — K591 Functional diarrhea: Secondary | ICD-10-CM | POA: Diagnosis not present

## 2024-08-25 DIAGNOSIS — J189 Pneumonia, unspecified organism: Secondary | ICD-10-CM | POA: Diagnosis not present

## 2024-08-25 DIAGNOSIS — R112 Nausea with vomiting, unspecified: Secondary | ICD-10-CM | POA: Diagnosis not present

## 2024-08-25 DIAGNOSIS — R058 Other specified cough: Secondary | ICD-10-CM | POA: Diagnosis not present

## 2024-08-25 DIAGNOSIS — R399 Unspecified symptoms and signs involving the genitourinary system: Secondary | ICD-10-CM | POA: Diagnosis not present

## 2024-08-25 DIAGNOSIS — J439 Emphysema, unspecified: Secondary | ICD-10-CM | POA: Diagnosis not present

## 2024-08-27 DIAGNOSIS — Z981 Arthrodesis status: Secondary | ICD-10-CM | POA: Diagnosis not present

## 2024-08-27 DIAGNOSIS — G8929 Other chronic pain: Secondary | ICD-10-CM | POA: Diagnosis not present

## 2024-08-27 DIAGNOSIS — M5442 Lumbago with sciatica, left side: Secondary | ICD-10-CM | POA: Diagnosis not present

## 2024-09-01 DIAGNOSIS — F331 Major depressive disorder, recurrent, moderate: Secondary | ICD-10-CM | POA: Diagnosis not present

## 2024-09-21 ENCOUNTER — Other Ambulatory Visit: Payer: Self-pay | Admitting: Endocrinology
